# Patient Record
Sex: Female | Born: 1995 | Hispanic: Yes | Marital: Married | Smoking: Never smoker
Health system: Southern US, Community
[De-identification: ages and names within clinical notes are randomized; demographics above are authoritative.]

## PROBLEM LIST (undated history)

## (undated) DIAGNOSIS — Z5189 Encounter for other specified aftercare: Secondary | ICD-10-CM

## (undated) DIAGNOSIS — K219 Gastro-esophageal reflux disease without esophagitis: Secondary | ICD-10-CM

## (undated) DIAGNOSIS — B999 Unspecified infectious disease: Secondary | ICD-10-CM

## (undated) DIAGNOSIS — O139 Gestational [pregnancy-induced] hypertension without significant proteinuria, unspecified trimester: Secondary | ICD-10-CM

## (undated) DIAGNOSIS — R519 Headache, unspecified: Secondary | ICD-10-CM

## (undated) HISTORY — DX: Gestational (pregnancy-induced) hypertension without significant proteinuria, unspecified trimester: O13.9

## (undated) HISTORY — PX: NO PAST SURGERIES: SHX2092

---

## 2015-08-06 DIAGNOSIS — Z9289 Personal history of other medical treatment: Secondary | ICD-10-CM

## 2019-04-01 NOTE — L&D Delivery Note (Addendum)
OB/GYN Faculty Practice Delivery Note  Nicole Lane is a 24 y.o. [id number] s/p VD @0739  at [redacted]w[redacted]d. She was admitted for IOL for ICP.   ROM: 5h 26m with clear fluid GBS Status: Neg Maximum Maternal Temperature: 98.16F   Labor Progress: Labor induction with foley balloon and oxytocin   Delivery Date/Time: 12/03/19 at 7:39AM Delivery: Called to room and patient was complete and pushing. Head delivered LOA. No nuchal cord present. Shoulder and body delivered in usual fashion. Infant with spontaneous cry, placed on mother's abdomen, dried and stimulated. Cord clamped x 2 after 1-minute delay, and cut by Dr. 02/02/20 given that parents deferred. Cord blood drawn. Placenta delivered spontaneously with gentle cord traction. Fundus firm with massage and Pitocin. Labia, perineum, vagina, and cervix were inspected, 1st degree laceration that was repaired and right sulcal that was hemostatic.   Placenta: Intact, 3-vessel cord Complications: None Lacerations: 1st degree perineal repaired, right sulcal that was hemostatic  EBL: 475cc Analgesia: Epidural  Infant: boy  APGARs 9, 9  weight pending   Lynnda Shields PGY-1 Family Medicine   I was gloved and present for the delivery of baby and personally performed delivery of the placenta and repair of 1st degree perineal laceration as noted above.  Sabino Dick, MD OB Fellow, Faculty Practice 12/03/2019 9:55 AM

## 2019-06-27 ENCOUNTER — Encounter: Payer: Self-pay | Admitting: General Practice

## 2019-06-29 ENCOUNTER — Ambulatory Visit (INDEPENDENT_AMBULATORY_CARE_PROVIDER_SITE_OTHER): Payer: Self-pay | Admitting: *Deleted

## 2019-06-29 ENCOUNTER — Encounter: Payer: Self-pay | Admitting: General Practice

## 2019-06-29 ENCOUNTER — Other Ambulatory Visit: Payer: Self-pay

## 2019-06-29 VITALS — BP 104/74 | HR 103 | Temp 98.1°F | Ht 65.0 in | Wt 186.3 lb

## 2019-06-29 DIAGNOSIS — O219 Vomiting of pregnancy, unspecified: Secondary | ICD-10-CM

## 2019-06-29 DIAGNOSIS — Z3480 Encounter for supervision of other normal pregnancy, unspecified trimester: Secondary | ICD-10-CM | POA: Insufficient documentation

## 2019-06-29 MED ORDER — PROMETHAZINE HCL 25 MG PO TABS: 25.0000 mg | ORAL_TABLET | Freq: Four times a day (QID) | ORAL | 1 refills | Status: DC | PRN

## 2019-06-29 NOTE — Progress Notes (Addendum)
   PRENATAL INTAKE SUMMARY  Ms. Nicole Lane presents today New OB Nurse Interview.  OB History    Gravida  2   Para  1   Term      Preterm  1   AB      Living  1     SAB      TAB      Ectopic      Multiple      Live Births  1          I have reviewed the patient's medical, obstetrical, social, and family histories, medications, and available lab results.  SUBJECTIVE She complains of nausea with vomiting.  OBJECTIVE Initial nurse interview for history/labs (New OB)  EDD: 12/22/2019 by early ultrasound GA: [redacted]w[redacted]d G2P0101 FHT: 156  GENERAL APPEARANCE: alert, well appearing, in no apparent distress, oriented to person, place and time, well hydrated   ASSESSMENT Normal pregnancy  PLAN Prenatal care-[facility] Pnl/HIV  OB Urine Culture GC/CT/PAP at next visit with Donia Ast, NP 07/27/19 HgbEval/SMA/CF ([facility]) at next visit due to insurance Panorama at next visit due to insurance If CHTN - P/C Ratio and CMP Continue PNV Rx for Phenergan 25 mg sent to pharmacy Has BP monitor and weight scale at home Patient to sign up for Babyscripts and MyChart  Clovis Pu, RN

## 2019-06-29 NOTE — Patient Instructions (Signed)
 Second Trimester of Pregnancy The second trimester is from week 14 through week 27 (months 4 through 6). The second trimester is often a time when you feel your best. Your body has adjusted to being pregnant, and you begin to feel better physically. Usually, morning sickness has lessened or quit completely, you may have more energy, and you may have an increase in appetite. The second trimester is also a time when the fetus is growing rapidly. At the end of the sixth month, the fetus is about 9 inches long and weighs about 1 pounds. You will likely begin to feel the baby move (quickening) between 16 and 20 weeks of pregnancy. Body changes during your second trimester Your body continues to go through many changes during your second trimester. The changes vary from woman to woman.  Your weight will continue to increase. You will notice your lower abdomen bulging out.  You may begin to get stretch marks on your hips, abdomen, and breasts.  You may develop headaches that can be relieved by medicines. The medicines should be approved by your health care provider.  You may urinate more often because the fetus is pressing on your bladder.  You may develop or continue to have heartburn as a result of your pregnancy.  You may develop constipation because certain hormones are causing the muscles that push waste through your intestines to slow down.  You may develop hemorrhoids or swollen, bulging veins (varicose veins).  You may have back pain. This is caused by: ? Weight gain. ? Pregnancy hormones that are relaxing the joints in your pelvis. ? A shift in weight and the muscles that support your balance.  Your breasts will continue to grow and they will continue to become tender.  Your gums may bleed and may be sensitive to brushing and flossing.  Dark spots or blotches (chloasma, mask of pregnancy) may develop on your face. This will likely fade after the baby is born.  A dark line from  your belly button to the pubic area (linea nigra) may appear. This will likely fade after the baby is born.  You may have changes in your hair. These can include thickening of your hair, rapid growth, and changes in texture. Some women also have hair loss during or after pregnancy, or hair that feels dry or thin. Your hair will most likely return to normal after your baby is born. What to expect at prenatal visits During a routine prenatal visit:  You will be weighed to make sure you and the fetus are growing normally.  Your blood pressure will be taken.  Your abdomen will be measured to track your baby's growth.  The fetal heartbeat will be listened to.  Any test results from the previous visit will be discussed. Your health care provider may ask you:  How you are feeling.  If you are feeling the baby move.  If you have had any abnormal symptoms, such as leaking fluid, bleeding, severe headaches, or abdominal cramping.  If you are using any tobacco products, including cigarettes, chewing tobacco, and electronic cigarettes.  If you have any questions. Other tests that may be performed during your second trimester include:  Blood tests that check for: ? Low iron levels (anemia). ? High blood sugar that affects pregnant women (gestational diabetes) between 24 and 28 weeks. ? Rh antibodies. This is to check for a protein on red blood cells (Rh factor).  Urine tests to check for infections, diabetes, or protein in   the urine.  An ultrasound to confirm the proper growth and development of the baby.  An amniocentesis to check for possible genetic problems.  Fetal screens for spina bifida and Down syndrome.  HIV (human immunodeficiency virus) testing. Routine prenatal testing includes screening for HIV, unless you choose not to have this test. Follow these instructions at home: Medicines  Follow your health care provider's instructions regarding medicine use. Specific medicines  may be either safe or unsafe to take during pregnancy.  Take a prenatal vitamin that contains at least 600 micrograms (mcg) of folic acid.  If you develop constipation, try taking a stool softener if your health care provider approves. Eating and drinking   Eat a balanced diet that includes fresh fruits and vegetables, whole grains, good sources of protein such as meat, eggs, or tofu, and low-fat dairy. Your health care provider will help you determine the amount of weight gain that is right for you.  Avoid raw meat and uncooked cheese. These carry germs that can cause birth defects in the baby.  If you have low calcium intake from food, talk to your health care provider about whether you should take a daily calcium supplement.  Limit foods that are high in fat and processed sugars, such as fried and sweet foods.  To prevent constipation: ? Drink enough fluid to keep your urine clear or pale yellow. ? Eat foods that are high in fiber, such as fresh fruits and vegetables, whole grains, and beans. Activity  Exercise only as directed by your health care provider. Most women can continue their usual exercise routine during pregnancy. Try to exercise for 30 minutes at least 5 days a week. Stop exercising if you experience uterine contractions.  Avoid heavy lifting, wear low heel shoes, and practice good posture.  A sexual relationship may be continued unless your health care provider directs you otherwise. Relieving pain and discomfort  Wear a good support bra to prevent discomfort from breast tenderness.  Take warm sitz baths to soothe any pain or discomfort caused by hemorrhoids. Use hemorrhoid cream if your health care provider approves.  Rest with your legs elevated if you have leg cramps or low back pain.  If you develop varicose veins, wear support hose. Elevate your feet for 15 minutes, 3-4 times a day. Limit salt in your diet. Prenatal Care  Write down your questions. Take  them to your prenatal visits.  Keep all your prenatal visits as told by your health care provider. This is important. Safety  Wear your seat belt at all times when driving.  Make a list of emergency phone numbers, including numbers for family, friends, the hospital, and police and fire departments. General instructions  Ask your health care provider for a referral to a local prenatal education class. Begin classes no later than the beginning of month 6 of your pregnancy.  Ask for help if you have counseling or nutritional needs during pregnancy. Your health care provider can offer advice or refer you to specialists for help with various needs.  Do not use hot tubs, steam rooms, or saunas.  Do not douche or use tampons or scented sanitary pads.  Do not cross your legs for long periods of time.  Avoid cat litter boxes and soil used by cats. These carry germs that can cause birth defects in the baby and possibly loss of the fetus by miscarriage or stillbirth.  Avoid all smoking, herbs, alcohol, and unprescribed drugs. Chemicals in these products can affect the   formation and growth of the baby.  Do not use any products that contain nicotine or tobacco, such as cigarettes and e-cigarettes. If you need help quitting, ask your health care provider.  Visit your dentist if you have not gone yet during your pregnancy. Use a soft toothbrush to brush your teeth and be gentle when you floss. Contact a health care provider if:  You have dizziness.  You have mild pelvic cramps, pelvic pressure, or nagging pain in the abdominal area.  You have persistent nausea, vomiting, or diarrhea.  You have a bad smelling vaginal discharge.  You have pain when you urinate. Get help right away if:  You have a fever.  You are leaking fluid from your vagina.  You have spotting or bleeding from your vagina.  You have severe abdominal cramping or pain.  You have rapid weight gain or weight loss.  You  have shortness of breath with chest pain.  You notice sudden or extreme swelling of your face, hands, ankles, feet, or legs.  You have not felt your baby move in over an hour.  You have severe headaches that do not go away when you take medicine.  You have vision changes. Summary  The second trimester is from week 14 through week 27 (months 4 through 6). It is also a time when the fetus is growing rapidly.  Your body goes through many changes during pregnancy. The changes vary from woman to woman.  Avoid all smoking, herbs, alcohol, and unprescribed drugs. These chemicals affect the formation and growth your baby.  Do not use any tobacco products, such as cigarettes, chewing tobacco, and e-cigarettes. If you need help quitting, ask your health care provider.  Contact your health care provider if you have any questions. Keep all prenatal visits as told by your health care provider. This is important. This information is not intended to replace advice given to you by your health care provider. Make sure you discuss any questions you have with your health care provider. Document Revised: 07/09/2018 Document Reviewed: 04/22/2016 Elsevier Patient Education  2020 [employer].  Warning Signs During Pregnancy A pregnancy lasts about 40 weeks, starting from the first day of your last period until the baby is born. Pregnancy is divided into three phases called trimesters.  The first trimester refers to week 1 through week 13 of pregnancy.  The second trimester is the start of week 14 through the end of week 27.  The third trimester is the start of week 28 until you deliver your baby. During each trimester of pregnancy, certain signs and symptoms may indicate a problem. Talk with your health care provider about your current health and any medical conditions you have. Make sure you know the symptoms that you should watch for and report. How does this affect me?  Warning signs in the first  trimester While some changes during the first trimester may be uncomfortable, most do not represent a serious problem. Let your health care provider know if you have any of the following warning signs in the first trimester:  You cannot eat or drink without vomiting, and this lasts for longer than a day.  You have vaginal bleeding or spotting along with menstrual-like cramping.  You have diarrhea for longer than a day.  You have a fever or other signs of infection, such as: ? Pain or burning when you urinate. ? Foul smelling or thick or yellowish vaginal discharge. Warning signs in the second trimester As your baby  grows and changes during the second trimester, there are additional signs and symptoms that may indicate a problem. These include:  Signs and symptoms of infection, including a fever.  Signs or symptoms of a miscarriage or preterm labor, such as regular contractions, menstrual-like cramping, or lower abdominal pain.  Bloody or watery vaginal discharge or obvious vaginal bleeding.  Feeling like your heart is pounding.  Having trouble breathing.  Nausea, vomiting, or diarrhea that lasts for longer than a day.  Craving non-food items, such as clay, chalk, or dirt. This may be a sign of a very treatable medical condition called pica. Later in your second trimester, watch for signs and symptoms of a serious medical condition called preeclampsia.These include:  Changes in your vision.  A severe headache that does not go away.  Nausea and vomiting. It is also important to notice if your baby stops moving or moves less than usual during this time. Warning signs in the third trimester As you approach the third trimester, your baby is growing and your body is preparing for the birth of your baby. In your third trimester, be sure to let your health care provider know if:  You have signs and symptoms of infection, including a fever.  You have vaginal bleeding.  You notice  that your baby is moving less than usual or is not moving.  You have nausea, vomiting, or diarrhea that lasts for longer than a day.  You have a severe headache that does not go away.  You have vision changes, including seeing spots or having blurry or double vision.  You have increased swelling in your hands or face. How does this affect my baby? Throughout your pregnancy, always report any of the warning signs of a problem to your health care provider. This can help prevent complications that may affect your baby, including:  Increased risk for premature birth.  Infection that may be transmitted to your baby.  Increased risk for stillbirth. Contact a health care provider if:  You have any of the warning signs of a problem for the current trimester of your pregnancy.  Any of the following apply to you during any trimester of pregnancy: ? You have strong emotions, such as sadness or anxiety, that interfere with work or personal relationships. ? You feel unsafe in your home and need help finding a safe place to live. ? You are using tobacco products, alcohol, or drugs and you need help to stop. Get help right away if: You have signs or symptoms of labor before 37 weeks of pregnancy. These include:  Contractions that are 5 minutes or less apart, or that increase in frequency, intensity, or length.  Sudden, sharp abdominal pain or low back pain.  Uncontrolled gush or trickle of fluid from your vagina. Summary  A pregnancy lasts about 40 weeks, starting from the first day of your last period until the baby is born. Pregnancy is divided into three phases called trimesters. Each trimester has warning signs to watch for.  Always report any warning signs to your health care provider in order to prevent complications that may affect both you and your baby.  Talk with your health care provider about your current health and any medical conditions you have. Make sure you know the symptoms  that you should watch for and report. This information is not intended to replace advice given to you by your health care provider. Make sure you discuss any questions you have with your health care provider. Document  Revised: 07/06/2018 Document Reviewed: 01/01/2017 Elsevier Patient Education  [place].

## 2019-06-30 LAB — OBSTETRIC PANEL, INCLUDING HIV
Antibody Screen: NEGATIVE
Basophils Absolute: 0 10*3/uL (ref 0.0–0.2)
Basos: 0 %
EOS (ABSOLUTE): 0.2 10*3/uL (ref 0.0–0.4)
Eos: 3 %
HIV Screen 4th Generation wRfx: NONREACTIVE
Hematocrit: 40.2 % (ref 34.0–46.6)
Hemoglobin: 13.1 g/dL (ref 11.1–15.9)
Hepatitis B Surface Ag: NEGATIVE
Immature Grans (Abs): 0 10*3/uL (ref 0.0–0.1)
Immature Granulocytes: 0 %
Lymphocytes Absolute: 1.3 10*3/uL (ref 0.7–3.1)
Lymphs: 17 %
MCH: 28.5 pg (ref 26.6–33.0)
MCHC: 32.6 g/dL (ref 31.5–35.7)
MCV: 88 fL (ref 79–97)
Monocytes Absolute: 0.4 10*3/uL (ref 0.1–0.9)
Monocytes: 6 %
Neutrophils Absolute: 5.8 10*3/uL (ref 1.4–7.0)
Neutrophils: 74 %
Platelets: 254 10*3/uL (ref 150–450)
RBC: 4.59 x10E6/uL (ref 3.77–5.28)
RDW: 13.9 % (ref 11.7–15.4)
RPR Ser Ql: NONREACTIVE
Rh Factor: POSITIVE
Rubella Antibodies, IGG: 1.16 index (ref >0.99)
WBC: 7.8 10*3/uL (ref 3.4–10.8)

## 2019-06-30 LAB — HEPATITIS C ANTIBODY: Hep C Virus Ab: <0.1 s/co ratio (ref 0.0–0.9)

## 2019-06-30 LAB — COMPREHENSIVE METABOLIC PANEL
ALT: 18 IU/L (ref 0–32)
AST: 21 IU/L (ref 0–40)
Albumin/Globulin Ratio: 1.7 (ref 1.2–2.2)
Albumin: 4 g/dL (ref 3.9–5.0)
Alkaline Phosphatase: 84 IU/L (ref 39–117)
BUN/Creatinine Ratio: 9 (ref 9–23)
BUN: 5 mg/dL — ABNORMAL LOW (ref 6–20)
Bilirubin Total: 0.4 mg/dL (ref 0.0–1.2)
CO2: 21 mmol/L (ref 20–29)
Calcium: 9.2 mg/dL (ref 8.7–10.2)
Chloride: 100 mmol/L (ref 96–106)
Creatinine, Ser: 0.54 mg/dL — ABNORMAL LOW (ref 0.57–1.00)
GFR calc Af Amer: 153 mL/min/{1.73_m2} (ref >59)
GFR calc non Af Amer: 133 mL/min/{1.73_m2} (ref >59)
Globulin, Total: 2.3 g/dL (ref 1.5–4.5)
Glucose: 74 mg/dL (ref 65–99)
Potassium: 3.8 mmol/L (ref 3.5–5.2)
Sodium: 135 mmol/L (ref 134–144)
Total Protein: 6.3 g/dL (ref 6.0–8.5)

## 2019-06-30 LAB — PROTEIN / CREATININE RATIO, URINE
Creatinine, Urine: 82.3 mg/dL
Protein, Ur: 13.9 mg/dL
Protein/Creat Ratio: 169 mg/g creat (ref 0–200)

## 2019-07-01 LAB — URINE CULTURE, OB REFLEX

## 2019-07-01 LAB — CULTURE, OB URINE

## 2019-07-07 ENCOUNTER — Encounter: Payer: Self-pay | Admitting: General Practice

## 2019-07-19 ENCOUNTER — Encounter: Payer: Self-pay | Admitting: Women's Health

## 2019-07-19 ENCOUNTER — Other Ambulatory Visit: Payer: Self-pay | Admitting: *Deleted

## 2019-07-25 ENCOUNTER — Other Ambulatory Visit: Payer: Self-pay | Admitting: *Deleted

## 2019-07-25 DIAGNOSIS — O219 Vomiting of pregnancy, unspecified: Secondary | ICD-10-CM

## 2019-07-25 MED ORDER — PROMETHAZINE HCL 25 MG PO TABS: 25.0000 mg | ORAL_TABLET | Freq: Four times a day (QID) | ORAL | 1 refills | Status: DC | PRN

## 2019-07-27 ENCOUNTER — Encounter: Payer: Self-pay | Admitting: Women's Health

## 2019-07-27 ENCOUNTER — Encounter: Payer: Self-pay | Admitting: General Practice

## 2019-07-27 ENCOUNTER — Other Ambulatory Visit (HOSPITAL_COMMUNITY)
Admission: RE | Admit: 2019-07-27 | Discharge: 2019-07-27 | Disposition: A | Discharge status: Home / Self Care | Payer: Medicaid Other | Source: Ambulatory Visit | Attending: Women's Health | Admitting: Women's Health

## 2019-07-27 ENCOUNTER — Other Ambulatory Visit: Payer: Self-pay

## 2019-07-27 ENCOUNTER — Ambulatory Visit (INDEPENDENT_AMBULATORY_CARE_PROVIDER_SITE_OTHER): Payer: Medicaid Other | Admitting: Women's Health

## 2019-07-27 VITALS — BP 108/73 | HR 99 | Temp 97.6°F | Wt 193.4 lb

## 2019-07-27 DIAGNOSIS — Z3480 Encounter for supervision of other normal pregnancy, unspecified trimester: Secondary | ICD-10-CM

## 2019-07-27 DIAGNOSIS — O09299 Supervision of pregnancy with other poor reproductive or obstetric history, unspecified trimester: Secondary | ICD-10-CM

## 2019-07-27 DIAGNOSIS — O219 Vomiting of pregnancy, unspecified: Secondary | ICD-10-CM

## 2019-07-27 MED ORDER — ASPIRIN EC 81 MG PO TBEC: 81.0000 mg | DELAYED_RELEASE_TABLET | Freq: Every day | ORAL | 4 refills | Status: DC

## 2019-07-27 NOTE — Progress Notes (Signed)
History:   Nicole Lane is a 24 y.o. G2P1001 at [redacted]w[redacted]d by LMP being seen today for her first obstetrical visit.  Her obstetrical history is significant for history of preeclampsia. Patient does intend to breast feed. Pregnancy history fully reviewed.  Pt reports this is a desired and planned pregnancy. Allergies: NKDA Current Medications: PNVs, phenergan PMH: No HTN, DM, asthma. PSH: none OB Hx: 2017 - preeclampsia, delivered 35/36 weeks d/t preeclampsia, NSVD Social Hx: pt does not smoke, drink, or use drugs. Family Hx: none  Patient reports no complaints.      HISTORY: OB History  Gravida Para Term Preterm AB Living  2 1 1  0 0 1  SAB TAB Ectopic Multiple Live Births  0 0 0 0 1    # Outcome Date GA Lbr Len/2nd Weight Sex Delivery Anes PTL Lv  2 Current           1 Term 08/06/15 [redacted]w[redacted]d  6 lb 3 oz (2.807 kg) M Vag-Spont EPI N LIV     Complications: History of blood transfusion     Apgar1: 8  Apgar5: 8    Last pap smear was done 01/2019 and was normal  Past Medical History:  Diagnosis Date  . Pregnancy induced hypertension    Past Surgical History:  Procedure Laterality Date  . NO PAST SURGERIES     Family History  Problem Relation Age of Onset  . Cancer Father    Social History   Tobacco Use  . Smoking status: Never Smoker  . Smokeless tobacco: Never Used  Substance Use Topics  . Alcohol use: Never  . Drug use: Never   No Known Allergies Current Outpatient Medications on File Prior to Visit  Medication Sig Dispense Refill  . Prenatal Vit-Fe Fumarate-FA (MULTIVITAMIN-PRENATAL) 27-0.8 MG TABS tablet Take 1 tablet by mouth daily at 12 noon.    . promethazine (PHENERGAN) 25 MG tablet Take 1 tablet (25 mg total) by mouth every 6 (six) hours as needed for nausea or vomiting. 30 tablet 1   No current facility-administered medications on file prior to visit.    Review of Systems Pertinent items noted in HPI and remainder of comprehensive ROS otherwise  negative. Physical Exam:   Vitals:   07/27/19 1446  BP: 108/73  Pulse: 99  Temp: 97.6 F (36.4 C)  Weight: 193 lb 6.4 oz (87.7 kg)   Fetal Heart Rate (bpm): 147 Uterus:   FH c/w dates  Pelvic Exam: Perineum: declined   Vulva: declined   Vagina:  declined   Cervix: declined   Adnexa: declined   Bony Pelvis: declined  System: General: well-developed, well-nourished female in no acute distress   Breasts:  declined   Skin: normal coloration and turgor, no rashes   Neurologic: oriented, normal, negative, normal mood   Extremities: normal strength, tone, and muscle mass, ROM of all joints is normal   HEENT PERRLA, extraocular movement intact and sclera clear, anicteric       Neck supple and no masses   Cardiovascular: regular rate and rhythm   Respiratory:  no respiratory distress, normal breath sounds   Abdomen: soft, non-tender; bowel sounds normal; no masses,  no organomegaly     Assessment:    Pregnancy: G2P1001 Patient Active Problem List   Diagnosis Date Noted  . Supervision of other normal pregnancy, antepartum 06/29/2019  . Nausea and vomiting of pregnancy, antepartum 06/29/2019     Plan:    1. Supervision of other normal pregnancy, antepartum -  Cervicovaginal ancillary only( [place]) - [place] MFM OB COMP + 14 WK; Future - AFP, Serum, Open Spina Bifida - Genetic Screening  2. Nausea and vomiting of pregnancy, antepartum -no concerns  3. Hx of preeclampsia, prior pregnancy, currently pregnant - aspirin EC 81 MG tablet; Take 1 tablet (81 mg total) by mouth daily.  Dispense: 30 tablet; Refill: 4   Initial labs drawn. Continue prenatal vitamins. Genetic Screening discussed, NIPS: requested. Patient aware that her insurance as it stands doe snot cover this testing and she may be required to pay out-of-pocket unless she can get her insurance switched over quickly after testing. Patient reports she is OK paying out-of-pocket for these tests. Ultrasound discussed;  fetal anatomic survey: requested. Problem list reviewed and updated. The nature of [place] [facility] with multiple MDs and other Advanced Practice Providers was explained to patient; also emphasized that residents, students are part of our team. Routine obstetric precautions reviewed. Return in about 4 weeks (around 08/24/2019) for virtual rob, needs anatomy scan ASAP.     Marylen Ponto, NP  3:28 PM 07/27/2019

## 2019-07-27 NOTE — Patient Instructions (Addendum)
Maternity Assessment Unit (MAU)  The Maternity Assessment Unit (MAU) is located at the [facility] at [facility]. The address is: [address], [place], [place], [place] [address]. Please see map below for additional directions.    The Maternity Assessment Unit is designed to help you during your pregnancy, and for up to 6 weeks after delivery, with any pregnancy- or postpartum-related emergencies, if you think you are in labor, or if your water has broken. For example, if you experience nausea and vomiting, vaginal bleeding, severe abdominal or pelvic pain, elevated blood pressure or other problems related to your pregnancy or postpartum time, please come to the Maternity Assessment Unit for assistance.         Second Trimester of Pregnancy The second trimester is from week 14 through week 27 (months 4 through 6). The second trimester is often a time when you feel your best. Your body has adjusted to being pregnant, and you begin to feel better physically. Usually, morning sickness has lessened or quit completely, you may have more energy, and you may have an increase in appetite. The second trimester is also a time when the fetus is growing rapidly. At the end of the sixth month, the fetus is about 9 inches long and weighs about 1 pounds. You will likely begin to feel the baby move (quickening) between 16 and 20 weeks of pregnancy. Body changes during your second trimester Your body continues to go through many changes during your second trimester. The changes vary from woman to woman.  Your weight will continue to increase. You will notice your lower abdomen bulging out.  You may begin to get stretch marks on your hips, abdomen, and breasts.  You may develop headaches that can be relieved by medicines. The medicines should be approved by your health care provider.  You may urinate more often because the fetus is pressing on your bladder.  You may  develop or continue to have heartburn as a result of your pregnancy.  You may develop constipation because certain hormones are causing the muscles that push waste through your intestines to slow down.  You may develop hemorrhoids or swollen, bulging veins (varicose veins).  You may have back pain. This is caused by: ? Weight gain. ? Pregnancy hormones that are relaxing the joints in your pelvis. ? A shift in weight and the muscles that support your balance.  Your breasts will continue to grow and they will continue to become tender.  Your gums may bleed and may be sensitive to brushing and flossing.  Dark spots or blotches (chloasma, mask of pregnancy) may develop on your face. This will likely fade after the baby is born.  A dark line from your belly button to the pubic area (linea nigra) may appear. This will likely fade after the baby is born.  You may have changes in your hair. These can include thickening of your hair, rapid growth, and changes in texture. Some women also have hair loss during or after pregnancy, or hair that feels dry or thin. Your hair will most likely return to normal after your baby is born. What to expect at prenatal visits During a routine prenatal visit:  You will be weighed to make sure you and the fetus are growing normally.  Your blood pressure will be taken.  Your abdomen will be measured to track your baby's growth.  The fetal heartbeat will be listened to.  Any test results from the previous  visit will be discussed. Your health care provider may ask you:  How you are feeling.  If you are feeling the baby move.  If you have had any abnormal symptoms, such as leaking fluid, bleeding, severe headaches, or abdominal cramping.  If you are using any tobacco products, including cigarettes, chewing tobacco, and electronic cigarettes.  If you have any questions. Other tests that may be performed during your second trimester include:  Blood tests  that check for: ? Low iron levels (anemia). ? High blood sugar that affects pregnant women (gestational diabetes) between 23 and 28 weeks. ? Rh antibodies. This is to check for a protein on red blood cells (Rh factor).  Urine tests to check for infections, diabetes, or protein in the urine.  An ultrasound to confirm the proper growth and development of the baby.  An amniocentesis to check for possible genetic problems.  Fetal screens for spina bifida and Down syndrome.  HIV (human immunodeficiency virus) testing. Routine prenatal testing includes screening for HIV, unless you choose not to have this test. Follow these instructions at home: Medicines  Follow your health care provider's instructions regarding medicine use. Specific medicines may be either safe or unsafe to take during pregnancy.  Take a prenatal vitamin that contains at least 600 micrograms (mcg) of folic acid.  If you develop constipation, try taking a stool softener if your health care provider approves. Eating and drinking   Eat a balanced diet that includes fresh fruits and vegetables, whole grains, good sources of protein such as meat, eggs, or tofu, and low-fat dairy. Your health care provider will help you determine the amount of weight gain that is right for you.  Avoid raw meat and uncooked cheese. These carry germs that can cause birth defects in the baby.  If you have low calcium intake from food, talk to your health care provider about whether you should take a daily calcium supplement.  Limit foods that are high in fat and processed sugars, such as fried and sweet foods.  To prevent constipation: ? Drink enough fluid to keep your urine clear or pale yellow. ? Eat foods that are high in fiber, such as fresh fruits and vegetables, whole grains, and beans. Activity  Exercise only as directed by your health care provider. Most women can continue their usual exercise routine during pregnancy. Try to  exercise for 30 minutes at least 5 days a week. Stop exercising if you experience uterine contractions.  Avoid heavy lifting, wear low heel shoes, and practice good posture.  A sexual relationship may be continued unless your health care provider directs you otherwise. Relieving pain and discomfort  Wear a good support bra to prevent discomfort from breast tenderness.  Take warm sitz baths to soothe any pain or discomfort caused by hemorrhoids. Use hemorrhoid cream if your health care provider approves.  Rest with your legs elevated if you have leg cramps or low back pain.  If you develop varicose veins, wear support hose. Elevate your feet for 15 minutes, 3-4 times a day. Limit salt in your diet. Prenatal Care  Write down your questions. Take them to your prenatal visits.  Keep all your prenatal visits as told by your health care provider. This is important. Safety  Wear your seat belt at all times when driving.  Make a list of emergency phone numbers, including numbers for family, friends, the hospital, and police and fire departments. General instructions  Ask your health care provider for a referral to  a local prenatal education class. Begin classes no later than the beginning of month 6 of your pregnancy.  Ask for help if you have counseling or nutritional needs during pregnancy. Your health care provider can offer advice or refer you to specialists for help with various needs.  Do not use hot tubs, steam rooms, or saunas.  Do not douche or use tampons or scented sanitary pads.  Do not cross your legs for long periods of time.  Avoid cat litter boxes and soil used by cats. These carry germs that can cause birth defects in the baby and possibly loss of the fetus by miscarriage or stillbirth.  Avoid all smoking, herbs, alcohol, and unprescribed drugs. Chemicals in these products can affect the formation and growth of the baby.  Do not use any products that contain nicotine  or tobacco, such as cigarettes and e-cigarettes. If you need help quitting, ask your health care provider.  Visit your dentist if you have not gone yet during your pregnancy. Use a soft toothbrush to brush your teeth and be gentle when you floss. Contact a health care provider if:  You have dizziness.  You have mild pelvic cramps, pelvic pressure, or nagging pain in the abdominal area.  You have persistent nausea, vomiting, or diarrhea.  You have a bad smelling vaginal discharge.  You have pain when you urinate. Get help right away if:  You have a fever.  You are leaking fluid from your vagina.  You have spotting or bleeding from your vagina.  You have severe abdominal cramping or pain.  You have rapid weight gain or weight loss.  You have shortness of breath with chest pain.  You notice sudden or extreme swelling of your face, hands, ankles, feet, or legs.  You have not felt your baby move in over an hour.  You have severe headaches that do not go away when you take medicine.  You have vision changes. Summary  The second trimester is from week 14 through week 27 (months 4 through 6). It is also a time when the fetus is growing rapidly.  Your body goes through many changes during pregnancy. The changes vary from woman to woman.  Avoid all smoking, herbs, alcohol, and unprescribed drugs. These chemicals affect the formation and growth your baby.  Do not use any tobacco products, such as cigarettes, chewing tobacco, and e-cigarettes. If you need help quitting, ask your health care provider.  Contact your health care provider if you have any questions. Keep all prenatal visits as told by your health care provider. This is important. This information is not intended to replace advice given to you by your health care provider. Make sure you discuss any questions you have with your health care provider. Document Revised: 07/09/2018 Document Reviewed: 04/22/2016 Elsevier  Patient Education  2020 [employer].        Alpha-Fetoprotein Test Why am I having this test? The alpha-fetoprotein test is most commonly used in pregnant women to help screen for birth defects in their unborn baby. It can be used to screen for birth defects, such as chromosome (DNA) abnormalities, problems with the brain or spinal cord, or problems with the abdominal wall of the unborn baby (fetus). The alpha-fetoprotein test may also be done for men or non-pregnant women to check for certain cancers. What is being tested? This test measures the amount of alpha-fetoprotein (AFP) in your blood. AFP is a protein that is made by the liver. Levels can be detected  in the mother's blood during pregnancy, starting at 10 weeks and peaking at 16-18 weeks of the pregnancy. Abnormal levels can sometimes be a sign of a birth defect in the baby. Certain cancers can cause a high level of AFP in men and non-pregnant women. What kind of sample is taken?  A blood sample is required for this test. It is usually collected by inserting a needle into a blood vessel. How are the results reported? Your test results will be reported as values. Your health care provider will compare your results to normal ranges that were established after testing a large group of people (reference values). Reference values may vary among labs and hospitals. For this test, common reference values are:  Adult: Less than 40 ng/mL or less than 40 mcg/L (SI units).  Child younger than 1 year: Less than 30 ng/mL. If you are pregnant, the values may also vary based on how long you have been pregnant. What do the results mean? Results that are above the reference values in pregnant women may indicate the following for the baby:  Neural tube defects, such as abnormalities of the spinal cord or brain.  Abdominal wall defects.  Multiple pregnancy such as twins.  Fetal distress or fetal death. Results that are above the reference  values in men or non-pregnant women may indicate:  Reproductive cancers, such as ovarian or testicular cancer.  Liver cancer.  Liver cell death.  Other types of cancer. Very low levels of AFP in pregnant women may indicate the following for the baby:  Down syndrome.  Fetal death. Talk with your health care provider about what your results mean. Questions to ask your health care provider Ask your health care provider, or the department that is doing the test:  When will my results be ready?  How will I get my results?  What are my treatment options?  What other tests do I need?  What are my next steps? Summary  The alpha-fetoprotein test is done on pregnant women to help screen for birth defects in their unborn baby.  Certain cancers can cause a high level of AFP in men and non-pregnant women.  For this test, a blood sample is usually collected by inserting a needle into a blood vessel.  Talk with your health care provider about what your results mean. This information is not intended to replace advice given to you by your health care provider. Make sure you discuss any questions you have with your health care provider. Document Revised: 02/27/2017 Document Reviewed: 10/21/2016 Elsevier Patient Education  2020 Elsevier Inc.      Preeclampsia and Eclampsia Preeclampsia is a serious condition that may develop during pregnancy. This condition causes high blood pressure and increased protein in your urine along with other symptoms, such as headaches and vision changes. These symptoms may develop as the condition gets worse. Preeclampsia may occur at 20 weeks of pregnancy or later. Diagnosing and treating preeclampsia early is very important. If not treated early, it can cause serious problems for you and your baby. One problem it can lead to is eclampsia. Eclampsia is a condition that causes muscle jerking or shaking (convulsions or seizures) and other serious problems for  the mother. During pregnancy, delivering your baby may be the best treatment for preeclampsia or eclampsia. For most women, preeclampsia and eclampsia symptoms go away after giving birth. In rare cases, a woman may develop preeclampsia after giving birth (postpartum preeclampsia). This usually occurs within 48 hours after childbirth  but may occur up to 6 weeks after giving birth. What are the causes? The cause of preeclampsia is not known. What increases the risk? The following risk factors make you more likely to develop preeclampsia:  Being pregnant for the first time.  Having had preeclampsia during a past pregnancy.  Having a family history of preeclampsia.  Having high blood pressure.  Being pregnant with more than one baby.  Being 77 or older.  Being African-American.  Having kidney disease or diabetes.  Having medical conditions such as lupus or blood diseases.  Being very overweight (obese). What are the signs or symptoms? The most common symptoms are:  Severe headaches.  Vision problems, such as blurred or double vision.  Abdominal pain, especially upper abdominal pain. Other symptoms that may develop as the condition gets worse include:  Sudden weight gain.  Sudden swelling of the hands, face, legs, and feet.  Severe nausea and vomiting.  Numbness in the face, arms, legs, and feet.  Dizziness.  Urinating less than usual.  Slurred speech.  Convulsions or seizures. How is this diagnosed? There are no screening tests for preeclampsia. Your health care provider will ask you about symptoms and check for signs of preeclampsia during your prenatal visits. You may also have tests that include:  Checking your blood pressure.  Urine tests to check for protein. Your health care provider will check for this at every prenatal visit.  Blood tests.  Monitoring your baby's heart rate.  Ultrasound. How is this treated? You and your health care provider will  determine the treatment approach that is best for you. Treatment may include:  Having more frequent prenatal exams to check for signs of preeclampsia, if you have an increased risk for preeclampsia.  Medicine to lower your blood pressure.  Staying in the hospital, if your condition is severe. There, treatment will focus on controlling your blood pressure and the amount of fluids in your body (fluid retention).  Taking medicine (magnesium sulfate) to prevent seizures. This may be given as an injection or through an IV.  Taking a low-dose aspirin during your pregnancy.  Delivering your baby early. You may have your labor started with medicine (induced), or you may have a cesarean delivery. Follow these instructions at home: Eating and drinking   Drink enough fluid to keep your urine pale yellow.  Avoid caffeine. Lifestyle  Do not use any products that contain nicotine or tobacco, such as cigarettes and e-cigarettes. If you need help quitting, ask your health care provider.  Do not use alcohol or drugs.  Avoid stress as much as possible. Rest and get plenty of sleep. General instructions  Take over-the-counter and prescription medicines only as told by your health care provider.  When lying down, lie on your left side. This keeps pressure off your major blood vessels.  When sitting or lying down, raise (elevate) your feet. Try putting some pillows underneath your lower legs.  Exercise regularly. Ask your health care provider what kinds of exercise are best for you.  Keep all follow-up and prenatal visits as told by your health care provider. This is important. How is this prevented? There is no known way of preventing preeclampsia or eclampsia from developing. However, to lower your risk of complications and detect problems early:  Get regular prenatal care. Your health care provider may be able to diagnose and treat the condition early.  Maintain a healthy weight. Ask your  health care provider for help managing weight gain during pregnancy.  Work with your health care provider to manage any long-term (chronic) health conditions you have, such as diabetes or kidney problems.  You may have tests of your blood pressure and kidney function after giving birth.  Your health care provider may have you take low-dose aspirin during your next pregnancy. Contact a health care provider if:  You have symptoms that your health care provider told you may require more treatment or monitoring, such as: ? Headaches. ? Nausea or vomiting. ? Abdominal pain. ? Dizziness. ? Light-headedness. Get help right away if:  You have severe: ? Abdominal pain. ? Headaches that do not get better. ? Dizziness. ? Vision problems. ? Confusion. ? Nausea or vomiting.  You have any of the following: ? A seizure. ? Sudden, rapid weight gain. ? Sudden swelling in your hands, ankles, or face. ? Trouble moving any part of your body. ? Numbness in any part of your body. ? Trouble speaking. ? Abnormal bleeding.  You faint. Summary  Preeclampsia is a serious condition that may develop during pregnancy.  This condition causes high blood pressure and increased protein in your urine along with other symptoms, such as headaches and vision changes.  Diagnosing and treating preeclampsia early is very important. If not treated early, it can cause serious problems for you and your baby.  Get help right away if you have symptoms that your health care provider told you to watch for. This information is not intended to replace advice given to you by your health care provider. Make sure you discuss any questions you have with your health care provider. Document Revised: 11/17/2017 Document Reviewed: 10/22/2015 Elsevier Patient Education  2020 [employer].

## 2019-07-28 LAB — CERVICOVAGINAL ANCILLARY ONLY
Bacterial Vaginitis (gardnerella): POSITIVE — AB
Candida Glabrata: NEGATIVE
Candida Vaginitis: NEGATIVE
Chlamydia: NEGATIVE
Comment: NEGATIVE
Comment: NEGATIVE
Comment: NEGATIVE
Comment: NEGATIVE
Comment: NEGATIVE
Comment: NORMAL
Neisseria Gonorrhea: NEGATIVE
Trichomonas: NEGATIVE

## 2019-07-29 LAB — AFP, SERUM, OPEN SPINA BIFIDA
AFP MoM: 0.63
AFP Value: 26.2 ng/mL
Gest. Age on Collection Date: 18.6 weeks
Maternal Age At EDD: 24.8 yr
OSBR Risk 1 IN: 10000
Test Results:: NEGATIVE
Weight: 192 [lb_av]

## 2019-08-09 ENCOUNTER — Encounter: Payer: Self-pay | Admitting: General Practice

## 2019-08-16 ENCOUNTER — Ambulatory Visit (HOSPITAL_COMMUNITY): Payer: Medicaid Other | Attending: Obstetrics and Gynecology

## 2019-08-16 ENCOUNTER — Other Ambulatory Visit: Payer: Self-pay

## 2019-08-16 DIAGNOSIS — Z3480 Encounter for supervision of other normal pregnancy, unspecified trimester: Secondary | ICD-10-CM | POA: Insufficient documentation

## 2019-08-16 DIAGNOSIS — Z3A21 21 weeks gestation of pregnancy: Secondary | ICD-10-CM

## 2019-08-16 DIAGNOSIS — Z363 Encounter for antenatal screening for malformations: Secondary | ICD-10-CM

## 2019-08-16 DIAGNOSIS — O09292 Supervision of pregnancy with other poor reproductive or obstetric history, second trimester: Secondary | ICD-10-CM

## 2019-08-25 ENCOUNTER — Telehealth (INDEPENDENT_AMBULATORY_CARE_PROVIDER_SITE_OTHER): Payer: Medicaid Other | Admitting: Obstetrics and Gynecology

## 2019-08-25 DIAGNOSIS — O219 Vomiting of pregnancy, unspecified: Secondary | ICD-10-CM

## 2019-08-25 DIAGNOSIS — Z3480 Encounter for supervision of other normal pregnancy, unspecified trimester: Secondary | ICD-10-CM

## 2019-08-25 DIAGNOSIS — O09299 Supervision of pregnancy with other poor reproductive or obstetric history, unspecified trimester: Secondary | ICD-10-CM

## 2019-08-25 DIAGNOSIS — O212 Late vomiting of pregnancy: Secondary | ICD-10-CM

## 2019-08-25 DIAGNOSIS — Z3A23 23 weeks gestation of pregnancy: Secondary | ICD-10-CM

## 2019-08-25 DIAGNOSIS — O09292 Supervision of pregnancy with other poor reproductive or obstetric history, second trimester: Secondary | ICD-10-CM

## 2019-08-25 NOTE — Progress Notes (Signed)
   MY CHART VIDEO VIRTUAL OBSTETRICS VISIT ENCOUNTER NOTE  I connected with Nicole Lane on 08/26/19 at 10:30 AM EDT by My Chart video at work and verified that I am speaking with the correct person using two identifiers. Provider located at [facility] for [facility] at [place].   I discussed the limitations, risks, security and privacy concerns of performing an evaluation and management service by My Chart video and the availability of in person appointments. I also discussed with the patient that there may be a patient respons ible charge related to this service. The patient expressed understanding and agreed to proceed.  Subjective:  Nicole Lane is a 24 y.o. G2P1001 at [redacted]w[redacted]d being followed for ongoing prenatal care.  She is currently monitored for the following issues for this low-risk pregnancy and has Supervision of other normal pregnancy, antepartum and Nausea and vomiting of pregnancy, antepartum on their problem list.  Patient reports no complaints. Reports fetal movement. Denies any contractions, bleeding or leaking of fluid.   The following portions of the patient's history were reviewed and updated as appropriate: allergies, current medications, past family history, past medical history, past social history, past surgical history and problem list.   Objective:   General:  Alert, oriented and cooperative.   Mental Status: Normal mood and affect perceived. Normal judgment and thought content.  Rest of physical exam deferred due to type of encounter  There were no vitals taken for this visit. **Patient not at home to do VS, will take later and send via My Chart  Assessment and Plan:  Pregnancy: G2P1001 at 105w0d  1. Supervision of other normal pregnancy, antepartum - Anticipatory guidance for 2 hr GTT nv - Advised to be fasting for 8 hours prior to   2. Hx of preeclampsia, prior pregnancy, currently pregnant - Continue taking bASA daily - Explained this is  to help prevent developing PEC in this pregnancy   Preterm labor symptoms and general obstetric precautions including but not limited to vaginal bleeding, contractions, leaking of fluid and fetal movement were reviewed in detail with the patient.  I discussed the assessment and treatment plan with the patient. The patient was provided an opportunity to ask questions and all were answered. The patient agreed with the plan and demonstrated an understanding of the instructions. The patient was advised to call back or seek an in-person office evaluation/go to [facility] at [facility] for any urgent or concerning symptoms. Please refer to After Visit Summary for other counseling recommendations.   I provided 5 minutes of non-face-to-face time during this encounter. There was 5 minutes of chart review time spent prior to this encounter. Total time spent = 10 minutes.  Return in about 5 weeks (around 09/29/2019) for Return OB 2hr GTT.  Future Appointments  Date Time Provider Department Center  09/28/2019  8:10 AM Judeth Horn, NP [facility] None    Raelyn Mora, CNM [facility], [facility]

## 2019-08-26 ENCOUNTER — Encounter: Payer: Self-pay | Admitting: Obstetrics and Gynecology

## 2019-08-30 ENCOUNTER — Other Ambulatory Visit: Payer: Self-pay | Admitting: *Deleted

## 2019-08-30 DIAGNOSIS — O219 Vomiting of pregnancy, unspecified: Secondary | ICD-10-CM

## 2019-08-30 MED ORDER — PROMETHAZINE HCL 25 MG PO TABS: 25.0000 mg | ORAL_TABLET | Freq: Four times a day (QID) | ORAL | 1 refills | Status: DC | PRN

## 2019-09-12 ENCOUNTER — Other Ambulatory Visit: Payer: Self-pay

## 2019-09-12 ENCOUNTER — Inpatient Hospital Stay (HOSPITAL_COMMUNITY)
Admission: AD | Admit: 2019-09-12 | Discharge: 2019-09-12 | Disposition: A | Discharge status: Home / Self Care | Payer: Medicaid Other | Attending: Obstetrics & Gynecology | Admitting: Obstetrics & Gynecology

## 2019-09-12 ENCOUNTER — Encounter (HOSPITAL_COMMUNITY): Payer: Self-pay | Admitting: Obstetrics & Gynecology

## 2019-09-12 DIAGNOSIS — Z8249 Family history of ischemic heart disease and other diseases of the circulatory system: Secondary | ICD-10-CM | POA: Diagnosis not present

## 2019-09-12 DIAGNOSIS — Z8759 Personal history of other complications of pregnancy, childbirth and the puerperium: Secondary | ICD-10-CM | POA: Insufficient documentation

## 2019-09-12 DIAGNOSIS — Z87440 Personal history of urinary (tract) infections: Secondary | ICD-10-CM | POA: Insufficient documentation

## 2019-09-12 DIAGNOSIS — R519 Headache, unspecified: Secondary | ICD-10-CM | POA: Diagnosis not present

## 2019-09-12 DIAGNOSIS — O26892 Other specified pregnancy related conditions, second trimester: Secondary | ICD-10-CM | POA: Diagnosis not present

## 2019-09-12 DIAGNOSIS — O10912 Unspecified pre-existing hypertension complicating pregnancy, second trimester: Secondary | ICD-10-CM | POA: Insufficient documentation

## 2019-09-12 DIAGNOSIS — O219 Vomiting of pregnancy, unspecified: Secondary | ICD-10-CM | POA: Diagnosis not present

## 2019-09-12 DIAGNOSIS — Z3A25 25 weeks gestation of pregnancy: Secondary | ICD-10-CM | POA: Insufficient documentation

## 2019-09-12 DIAGNOSIS — Z79899 Other long term (current) drug therapy: Secondary | ICD-10-CM | POA: Diagnosis not present

## 2019-09-12 DIAGNOSIS — Z7982 Long term (current) use of aspirin: Secondary | ICD-10-CM | POA: Diagnosis not present

## 2019-09-12 HISTORY — DX: Encounter for other specified aftercare: Z51.89

## 2019-09-12 HISTORY — DX: Unspecified infectious disease: B99.9

## 2019-09-12 LAB — COMPREHENSIVE METABOLIC PANEL
ALT: 15 U/L (ref 0–44)
AST: 14 U/L — ABNORMAL LOW (ref 15–41)
Albumin: 2.7 g/dL — ABNORMAL LOW (ref 3.5–5.0)
Alkaline Phosphatase: 88 U/L (ref 38–126)
Anion gap: 9 (ref 5–15)
BUN: 6 mg/dL (ref 6–20)
CO2: 21 mmol/L — ABNORMAL LOW (ref 22–32)
Calcium: 8.6 mg/dL — ABNORMAL LOW (ref 8.9–10.3)
Chloride: 104 mmol/L (ref 98–111)
Creatinine, Ser: 0.5 mg/dL (ref 0.44–1.00)
GFR calc Af Amer: >60 mL/min (ref >60)
GFR calc non Af Amer: >60 mL/min (ref >60)
Glucose, Bld: 90 mg/dL (ref 70–99)
Potassium: 3.3 mmol/L — ABNORMAL LOW (ref 3.5–5.1)
Sodium: 134 mmol/L — ABNORMAL LOW (ref 135–145)
Total Bilirubin: 0.4 mg/dL (ref 0.3–1.2)
Total Protein: 6.2 g/dL — ABNORMAL LOW (ref 6.5–8.1)

## 2019-09-12 LAB — URINALYSIS, ROUTINE W REFLEX MICROSCOPIC
Bilirubin Urine: NEGATIVE
Glucose, UA: NEGATIVE mg/dL
Hgb urine dipstick: NEGATIVE
Ketones, ur: NEGATIVE mg/dL
Nitrite: NEGATIVE
Protein, ur: NEGATIVE mg/dL
Specific Gravity, Urine: 1.002 — ABNORMAL LOW (ref 1.005–1.030)
pH: 6 (ref 5.0–8.0)

## 2019-09-12 LAB — CBC
HCT: 33.7 % — ABNORMAL LOW (ref 36.0–46.0)
Hemoglobin: 10.8 g/dL — ABNORMAL LOW (ref 12.0–15.0)
MCH: 27.6 pg (ref 26.0–34.0)
MCHC: 32 g/dL (ref 30.0–36.0)
MCV: 86.2 fL (ref 80.0–100.0)
Platelets: 298 10*3/uL (ref 150–400)
RBC: 3.91 MIL/uL (ref 3.87–5.11)
RDW: 13.2 % (ref 11.5–15.5)
WBC: 11.3 10*3/uL — ABNORMAL HIGH (ref 4.0–10.5)
nRBC: 0 % (ref 0.0–0.2)

## 2019-09-12 LAB — PROTEIN / CREATININE RATIO, URINE
Creatinine, Urine: 18.81 mg/dL
Total Protein, Urine: <6 mg/dL

## 2019-09-12 MED ORDER — ACETAMINOPHEN 500 MG PO TABS
1000.0000 mg | ORAL_TABLET | Freq: Once | ORAL | Status: AC
  Administered 2019-09-12: 1000 mg via ORAL
  Filled 2019-09-12: qty 2

## 2019-09-12 NOTE — Discharge Instructions (Signed)
Hypertension During Pregnancy High blood pressure (hypertension) is when the force of blood pumping through the arteries is too strong. Arteries are blood vessels that carry blood from the heart throughout the body. Hypertension during pregnancy can be mild or severe. Severe hypertension during pregnancy (preeclampsia) is a medical emergency that requires prompt evaluation and treatment. Different types of hypertension can happen during pregnancy. These include:  Chronic hypertension. This happens when you had high blood pressure before you became pregnant, and it continues during the pregnancy. Hypertension that develops before you are [redacted] weeks pregnant and continues during the pregnancy is also called chronic hypertension. If you have chronic hypertension, it will not go away after you have your baby. You will need follow-up visits with your health care provider after you have your baby. Your doctor may want you to keep taking medicine for your blood pressure.  Gestational hypertension. This is hypertension that develops after the 20th week of pregnancy. Gestational hypertension usually goes away after you have your baby, but your health care provider will need to monitor your blood pressure to make sure that it is getting better.  Preeclampsia. This is severe hypertension during pregnancy. This can cause serious complications for you and your baby and can also cause complications for you after the delivery of your baby.  Postpartum preeclampsia. You may develop severe hypertension after giving birth. This usually occurs within 48 hours after childbirth but may occur up to 6 weeks after giving birth. This is rare. How does this affect me? Women who have hypertension during pregnancy have a greater chance of developing hypertension later in life or during future pregnancies. In some cases, hypertension during pregnancy can cause serious complications, such as:  Stroke.  Heart attack.  Injury to  other organs, such as kidneys, lungs, or liver.  Preeclampsia.  Convulsions or seizures.  Placental abruption. How does this affect my baby? Hypertension during pregnancy can affect your baby. Your baby may:  Be born early (prematurely).  Not weigh as much as he or she should at birth (low birth weight).  Not tolerate labor well, leading to an unplanned cesarean delivery. What are the risks? There are certain factors that make it more likely for you to develop hypertension during pregnancy. These include:  Having hypertension during a previous pregnancy.  Being overweight.  Being age 35 or older.  Being pregnant for the first time.  Being pregnant with more than one baby.  Becoming pregnant using fertilization methods, such as IVF (in vitro fertilization).  Having other medical problems, such as diabetes, kidney disease, or lupus.  Having a family history of hypertension. What can I do to lower my risk? The exact cause of hypertension during pregnancy is not known. You may be able to lower your risk by:  Maintaining a healthy weight.  Eating a healthy and balanced diet.  Following your health care provider's instructions about treating any long-term conditions that you had before becoming pregnant. It is very important to keep all of your prenatal care appointments. Your health care provider will check your blood pressure and make sure that your pregnancy is progressing as expected. If a problem is found, early treatment can prevent complications. How is this treated? Treatment for hypertension during pregnancy varies depending on the type of hypertension you have and how serious it is.  If you were taking medicine for high blood pressure before you became pregnant, talk with your health care provider. You may need to change medicine during pregnancy because   some medicines, like ACE inhibitors, may not be considered safe for your baby.  If you have gestational  hypertension, your health care provider may order medicine to treat this during pregnancy.  If you are at risk for preeclampsia, your health care provider may recommend that you take a low-dose aspirin during your pregnancy.  If you have severe hypertension, you may need to be hospitalized so you and your baby can be monitored closely. You may also need to be given medicine to lower your blood pressure. This medicine may be given by mouth or through an IV.  In some cases, if your condition gets worse, you may need to deliver your baby early. Follow these instructions at home: Eating and drinking   Drink enough fluid to keep your urine pale yellow.  Avoid caffeine. Lifestyle  Do not use any products that contain nicotine or tobacco, such as cigarettes, e-cigarettes, and chewing tobacco. If you need help quitting, ask your health care provider.  Do not use alcohol or drugs.  Avoid stress as much as possible.  Rest and get plenty of sleep.  Regular exercise can help to reduce your blood pressure. Ask your health care provider what kinds of exercise are best for you. General instructions  Take over-the-counter and prescription medicines only as told by your health care provider.  Keep all prenatal and follow-up visits as told by your health care provider. This is important. Contact a health care provider if:  You have symptoms that your health care provider told you may require more treatment or monitoring, such as: ? Headaches. ? Nausea or vomiting. ? Abdominal pain. ? Dizziness. ? Light-headedness. Get help right away if:  You have: ? Severe abdominal pain that does not get better with treatment. ? A severe headache that does not get better. ? Vomiting that does not get better. ? Sudden, rapid weight gain. ? Sudden swelling in your hands, ankles, or face. ? Vaginal bleeding. ? Blood in your urine. ? Blurred or double vision. ? Shortness of breath or chest  pain. ? Weakness on one side of your body. ? Difficulty speaking.  Your baby is not moving as much as usual. Summary  High blood pressure (hypertension) is when the force of blood pumping through the arteries is too strong.  Hypertension during pregnancy can cause problems for you and your baby.  Treatment for hypertension during pregnancy varies depending on the type of hypertension you have and how serious it is.  Keep all prenatal and follow-up visits as told by your health care provider. This is important. This information is not intended to replace advice given to you by your health care provider. Make sure you discuss any questions you have with your health care provider. Document Revised: 07/08/2018 Document Reviewed: 04/13/2018 Elsevier Patient Education  2020 [employer] General Headache Without Cause A headache is pain or discomfort felt around the head or neck area. The specific cause of a headache may not be found. There are many causes and types of headaches. A few common ones are:  Tension headaches.  Migraine headaches.  Cluster headaches.  Chronic daily headaches. Follow these instructions at home: Watch your condition for any changes. Let your health care provider know about them. Take these steps to help with your condition: Managing pain      Take over-the-counter and prescription medicines only as told by your health care provider.  Lie down in a dark, quiet room when you have a headache.  If directed,   put ice on your head and neck area: ? Put ice in a plastic bag. ? Place a towel between your skin and the bag. ? Leave the ice on for 20 minutes, 2-3 times per day.  If directed, apply heat to the affected area. Use the heat source that your health care provider recommends, such as a moist heat pack or a heating pad. ? Place a towel between your skin and the heat source. ? Leave the heat on for 20-30 minutes. ? Remove the heat if your skin turns  bright red. This is especially important if you are unable to feel pain, heat, or cold. You may have a greater risk of getting burned.  Keep lights dim if bright lights bother you or make your headaches worse. Eating and drinking  Eat meals on a regular schedule.  If you drink alcohol: ? Limit how much you use to:  0-1 drink a day for women.  0-2 drinks a day for men. ? Be aware of how much alcohol is in your drink. In the [place] one drink equals one 12 oz bottle of beer (355 mL), one 5 oz glass of wine (148 mL), or one 1 oz glass of hard liquor (44 mL).  Stop drinking caffeine, or decrease the amount of caffeine you drink. General instructions   Keep a headache journal to help find out what may trigger your headaches. For example, write down: ? What you eat and drink. ? How much sleep you get. ? Any change to your diet or medicines.  Try massage or other relaxation techniques.  Limit stress.  Sit up straight, and do not tense your muscles.  Do not use any products that contain nicotine or tobacco, such as cigarettes, e-cigarettes, and chewing tobacco. If you need help quitting, ask your health care provider.  Exercise regularly as told by your health care provider.  Sleep on a regular schedule. Get 7-9 hours of sleep each night, or the amount recommended by your health care provider.  Keep all follow-up visits as told by your health care provider. This is important. Contact a health care provider if:  Your symptoms are not helped by medicine.  You have a headache that is different from the usual headache.  You have nausea or you vomit.  You have a fever. Get help right away if:  Your headache becomes severe quickly.  Your headache gets worse after moderate to intense physical activity.  You have repeated vomiting.  You have a stiff neck.  You have a loss of vision.  You have problems with speech.  You have pain in the eye or ear.  You have muscular  weakness or loss of muscle control.  You lose your balance or have trouble walking.  You feel faint or pass out.  You have confusion.  You have a seizure. Summary  A headache is pain or discomfort felt around the head or neck area.  There are many causes and types of headaches. In some cases, the cause may not be found.  Keep a headache journal to help find out what may trigger your headaches. Watch your condition for any changes. Let your health care provider know about them.  Contact a health care provider if you have a headache that is different from the usual headache, or if your symptoms are not helped by medicine.  Get help right away if your headache becomes severe, you vomit, you have a loss of vision, you lose your balance, or   you have a seizure. This information is not intended to replace advice given to you by your health care provider. Make sure you discuss any questions you have with your health care provider. Document Revised: 10/05/2017 Document Reviewed: 10/05/2017 Elsevier Patient Education  2020 [employer]  

## 2019-09-12 NOTE — MAU Note (Signed)
Woke up with a HA today.  Monitors BP, hx of PRE-E.  BP was elevated. Called her nurse and was instructed to come here. Did not take anything for HA.  Denies visual changes, epigastric pain or increase in swelling.

## 2019-09-12 NOTE — MAU Provider Note (Signed)
Chief Complaint:  Headache and Hypertension   First Provider Initiated Contact with Patient 09/12/19 1001     HPI: Nicole Lane is a 24 y.o. G2P1001 at [redacted]w[redacted]d who presents to maternity admissions reporting headache & high blood pressure. Symptoms started this morning while at work. Reports frontal headache that she rates 6/10. Hasn't treated symptoms. Pain doesn't radiate. Gradual onset. Nothing makes better or worse. History of preeclampsia in previous pregnancy. Took BP at work using wrist cuff with elevation in BP (138/85 & 164/110). Denies visual disturbance or epigastric pain. No other complaints. Good fetal movement.   Pregnancy Course: [facility]. Hx preeclampsia.   Past Medical History:  Diagnosis Date  . Blood transfusion without reported diagnosis    PPH  . Infection    UTI  . Pregnancy induced hypertension    OB History  Gravida Para Term Preterm AB Living  2 1 1  0   1  SAB TAB Ectopic Multiple Live Births          1    # Outcome Date GA Lbr Len/2nd Weight Sex Delivery Anes PTL Lv  2 Current           1 Term 08/06/15 [redacted]w[redacted]d  2807 g M Vag-Spont EPI N LIV     Complications: History of blood transfusion   Past Surgical History:  Procedure Laterality Date  . NO PAST SURGERIES     Family History  Problem Relation Age of Onset  . Cancer Father   . Hypertension Father    Social History   Tobacco Use  . Smoking status: Never Smoker  . Smokeless tobacco: Never Used  Vaping Use  . Vaping Use: Never used  Substance Use Topics  . Alcohol use: Never  . Drug use: Never   No Known Allergies Medications Prior to Admission  Medication Sig Dispense Refill Last Dose  . aspirin EC 81 MG tablet Take 1 tablet (81 mg total) by mouth daily. 30 tablet 4 09/11/2019 at Unknown time  . Prenatal Vit-Fe Fumarate-FA (MULTIVITAMIN-PRENATAL) 27-0.8 MG TABS tablet Take 1 tablet by mouth daily at 12 noon.   09/12/2019 at Unknown time  . promethazine (PHENERGAN) 25 MG tablet Take 1 tablet  (25 mg total) by mouth every 6 (six) hours as needed for nausea or vomiting. 30 tablet 1 09/12/2019 at 0630    I have reviewed patient's Past Medical Hx, Surgical Hx, Family Hx, Social Hx, medications and allergies.   ROS:  Review of Systems  Constitutional: Negative.   Eyes: Negative for visual disturbance.  Gastrointestinal: Negative.   Genitourinary: Negative.   Neurological: Positive for headaches.    Physical Exam   Patient Vitals for the past 24 hrs:  BP Temp Pulse Resp SpO2 Height Weight  09/12/19 1000 107/72 -- 100 -- -- -- --  09/12/19 0945 108/73 -- 94 -- -- -- --  09/12/19 0914 121/78 98.5 F (36.9 C) (!) 118 17 99 % 5\' 6"  (1.676 m) 92.1 kg    Constitutional: Well-developed, well-nourished female in no acute distress.  Cardiovascular: normal rate & rhythm, no murmur Respiratory: normal effort, lung sounds clear throughout GI: Abd soft, non-tender, gravid appropriate for gestational age. Pos BS x 4 MS: Extremities nontender, no edema, normal ROM Neurologic: Alert and oriented x 4.   Fetal Tracing:  Baseline: 144 Variability: moderate Accelerations: 10x10 Decelerations:none  Toco: none    Labs: Results for orders placed or performed during the hospital encounter of 09/12/19 (from the past 24 hour(s))  Urinalysis, Routine  w reflex microscopic     Status: Abnormal   Collection Time: 09/12/19 10:22 AM  Result Value Ref Range   Color, Urine STRAW (A) YELLOW   APPearance CLEAR CLEAR   Specific Gravity, Urine 1.002 (L) 1.005 - 1.030   pH 6.0 5.0 - 8.0   Glucose, UA NEGATIVE NEGATIVE mg/dL   Hgb urine dipstick NEGATIVE NEGATIVE   Bilirubin Urine NEGATIVE NEGATIVE   Ketones, ur NEGATIVE NEGATIVE mg/dL   Protein, ur NEGATIVE NEGATIVE mg/dL   Nitrite NEGATIVE NEGATIVE   Leukocytes,Ua TRACE (A) NEGATIVE   RBC / HPF 0-5 0 - 5 RBC/hpf   WBC, UA 0-5 0 - 5 WBC/hpf   Bacteria, UA RARE (A) NONE SEEN   Squamous Epithelial / LPF 0-5 0 - 5  Protein / creatinine ratio,  urine     Status: None   Collection Time: 09/12/19 10:23 AM  Result Value Ref Range   Creatinine, Urine 18.81 mg/dL   Total Protein, Urine <6 mg/dL   Protein Creatinine Ratio        0.00 - 0.15 mg/mg[Cre]  CBC     Status: Abnormal   Collection Time: 09/12/19 11:11 AM  Result Value Ref Range   WBC 11.3 (H) 4.0 - 10.5 K/uL   RBC 3.91 3.87 - 5.11 MIL/uL   Hemoglobin 10.8 (L) 12.0 - 15.0 g/dL   HCT 82.8 (L) 36 - 46 %   MCV 86.2 80.0 - 100.0 fL   MCH 27.6 26.0 - 34.0 pg   MCHC 32.0 30.0 - 36.0 g/dL   RDW 00.3 49.1 - 79.1 %   Platelets 298 150 - 400 K/uL   nRBC 0.0 0.0 - 0.2 %  Comprehensive metabolic panel     Status: Abnormal   Collection Time: 09/12/19 11:11 AM  Result Value Ref Range   Sodium 134 (L) 135 - 145 mmol/L   Potassium 3.3 (L) 3.5 - 5.1 mmol/L   Chloride 104 98 - 111 mmol/L   CO2 21 (L) 22 - 32 mmol/L   Glucose, Bld 90 70 - 99 mg/dL   BUN 6 6 - 20 mg/dL   Creatinine, Ser 5.05 0.44 - 1.00 mg/dL   Calcium 8.6 (L) 8.9 - 10.3 mg/dL   Total Protein 6.2 (L) 6.5 - 8.1 g/dL   Albumin 2.7 (L) 3.5 - 5.0 g/dL   AST 14 (L) 15 - 41 U/L   ALT 15 0 - 44 U/L   Alkaline Phosphatase 88 38 - 126 U/L   Total Bilirubin 0.4 0.3 - 1.2 mg/dL   GFR calc non Af Amer >60 >60 mL/min   GFR calc Af Amer >60 >60 mL/min   Anion gap 9 5 - 15    Imaging:  No results found.  MAU Course: Orders Placed This Encounter  Procedures  . Urinalysis, Routine w reflex microscopic  . CBC  . Comprehensive metabolic panel  . Protein / creatinine ratio, urine  . Discharge patient   Meds ordered this encounter  Medications  . acetaminophen (TYLENOL) tablet 1,000 mg    MDM: Normotensive in MAU but reports elevated BPs while at work. Preeclampsia labs done & are normal.  Headache resolved with 1 gm of tylenol  Assessment: 1. Headache in pregnancy, antepartum, second trimester   2. [redacted] weeks gestation of pregnancy   3. Nausea and vomiting of pregnancy, antepartum     Plan: Discharge home in  stable condition.  HTN precautions Tylenol prn headaches    Allergies as of 09/12/2019  No Known Allergies     Medication List    TAKE these medications   aspirin EC 81 MG tablet Take 1 tablet (81 mg total) by mouth daily.   multivitamin-prenatal 27-0.8 MG Tabs tablet Take 1 tablet by mouth daily at 12 noon.   promethazine 25 MG tablet Commonly known as: PHENERGAN Take 1 tablet (25 mg total) by mouth every 6 (six) hours as needed for nausea or vomiting.       Judeth Horn, NP 09/12/2019 12:27 PM

## 2019-09-28 ENCOUNTER — Encounter: Payer: Self-pay | Admitting: General Practice

## 2019-09-28 ENCOUNTER — Other Ambulatory Visit: Payer: Self-pay

## 2019-09-28 ENCOUNTER — Ambulatory Visit (INDEPENDENT_AMBULATORY_CARE_PROVIDER_SITE_OTHER): Payer: Medicaid Other | Admitting: Student

## 2019-09-28 ENCOUNTER — Encounter: Payer: Self-pay | Admitting: Student

## 2019-09-28 VITALS — BP 113/71 | HR 101 | Temp 98.1°F | Wt 203.0 lb

## 2019-09-28 DIAGNOSIS — Z3A27 27 weeks gestation of pregnancy: Secondary | ICD-10-CM

## 2019-09-28 DIAGNOSIS — Z23 Encounter for immunization: Secondary | ICD-10-CM

## 2019-09-28 DIAGNOSIS — Z3482 Encounter for supervision of other normal pregnancy, second trimester: Secondary | ICD-10-CM | POA: Diagnosis not present

## 2019-09-28 DIAGNOSIS — Z3480 Encounter for supervision of other normal pregnancy, unspecified trimester: Secondary | ICD-10-CM

## 2019-09-28 NOTE — Progress Notes (Signed)
   PRENATAL VISIT NOTE  Subjective:  Nicole Lane is a 24 y.o. G2P1001 at [redacted]w[redacted]d being seen today for ongoing prenatal care.  She is currently monitored for the following issues for this low-risk pregnancy and has Supervision of other normal pregnancy, antepartum and Nausea and vomiting of pregnancy, antepartum on their problem list.  Patient reports no complaints.  Contractions: Not present. Vag. Bleeding: None.  Movement: Present. Denies leaking of fluid.   The following portions of the patient's history were reviewed and updated as appropriate: allergies, current medications, past family history, past medical history, past social history, past surgical history and problem list.   Objective:   Vitals:   09/28/19 0817  BP: 113/71  Pulse: (!) 101  Temp: 98.1 F (36.7 C)  Weight: 203 lb (92.1 kg)    Fetal Status: Fetal Heart Rate (bpm): 143 Fundal Height: 26 cm Movement: Present     General:  Alert, oriented and cooperative. Patient is in no acute distress.  Skin: Skin is warm and dry. No rash noted.   Cardiovascular: Normal heart rate noted  Respiratory: Normal respiratory effort, no problems with respiration noted  Abdomen: Soft, gravid, appropriate for gestational age.  Pain/Pressure: Absent     Pelvic: Cervical exam deferred        Extremities: Normal range of motion.  Edema: None  Mental Status: Normal mood and affect. Normal behavior. Normal judgment and thought content.   Assessment and Plan:  Pregnancy: G2P1001 at [redacted]w[redacted]d 1. Supervision of other normal pregnancy, antepartum -No longer interested in BTL. Previously used mirena but doesn't want that due to amenorrhea. Still wants regular periods. Discussed pills vs ring.   - Glucose Tolerance, 2 Hours w/1 Hour - HIV Antibody (routine testing w rflx) - RPR - CBC - Tdap vaccine greater than or equal to 7yo IM  2. Need for tetanus, diphtheria, and acellular pertussis (Tdap) vaccine in patient of adolescent age or  older  - Tdap vaccine greater than or equal to 7yo IM  Preterm labor symptoms and general obstetric precautions including but not limited to vaginal bleeding, contractions, leaking of fluid and fetal movement were reviewed in detail with the patient. Please refer to After Visit Summary for other counseling recommendations.   Return in about 2 weeks (around 10/12/2019) for Routine OB, vitual.  No future appointments.  Judeth Horn, NP

## 2019-09-28 NOTE — Patient Instructions (Signed)
Third Trimester of Pregnancy The third trimester is from week 28 through week 40 (months 7 through 9). The third trimester is a time when the unborn baby (fetus) is growing rapidly. At the end of the ninth month, the fetus is about 20 inches in length and weighs 6-10 pounds. Body changes during your third trimester Your body will continue to go through many changes during pregnancy. The changes vary from woman to woman. During the third trimester:  Your weight will continue to increase. You can expect to gain 25-35 pounds (11-16 kg) by the end of the pregnancy.  You may begin to get stretch marks on your hips, abdomen, and breasts.  You may urinate more often because the fetus is moving lower into your pelvis and pressing on your bladder.  You may develop or continue to have heartburn. This is caused by increased hormones that slow down muscles in the digestive tract.  You may develop or continue to have constipation because increased hormones slow digestion and cause the muscles that push waste through your intestines to relax.  You may develop hemorrhoids. These are swollen veins (varicose veins) in the rectum that can itch or be painful.  You may develop swollen, bulging veins (varicose veins) in your legs.  You may have increased body aches in the pelvis, back, or thighs. This is due to weight gain and increased hormones that are relaxing your joints.  You may have changes in your hair. These can include thickening of your hair, rapid growth, and changes in texture. Some women also have hair loss during or after pregnancy, or hair that feels dry or thin. Your hair will most likely return to normal after your baby is born.  Your breasts will continue to grow and they will continue to become tender. A yellow fluid (colostrum) may leak from your breasts. This is the first milk you are producing for your baby.  Your belly button may stick out.  You may notice more swelling in your hands,  face, or ankles.  You may have increased tingling or numbness in your hands, arms, and legs. The skin on your belly may also feel numb.  You may feel short of breath because of your expanding uterus.  You may have more problems sleeping. This can be caused by the size of your belly, increased need to urinate, and an increase in your body's metabolism.  You may notice the fetus "dropping," or moving lower in your abdomen (lightening).  You may have increased vaginal discharge.  You may notice your joints feel loose and you may have pain around your pelvic bone. What to expect at prenatal visits You will have prenatal exams every 2 weeks until week 36. Then you will have weekly prenatal exams. During a routine prenatal visit:  You will be weighed to make sure you and the baby are growing normally.  Your blood pressure will be taken.  Your abdomen will be measured to track your baby's growth.  The fetal heartbeat will be listened to.  Any test results from the previous visit will be discussed.  You may have a cervical check near your due date to see if your cervix has softened or thinned (effaced).  You will be tested for Group B streptococcus. This happens between 35 and 37 weeks. Your health care provider may ask you:  What your birth plan is.  How you are feeling.  If you are feeling the baby move.  If you have had any abnormal   symptoms, such as leaking fluid, bleeding, severe headaches, or abdominal cramping.  If you are using any tobacco products, including cigarettes, chewing tobacco, and electronic cigarettes.  If you have any questions. Other tests or screenings that may be performed during your third trimester include:  Blood tests that check for low iron levels (anemia).  Fetal testing to check the health, activity level, and growth of the fetus. Testing is done if you have certain medical conditions or if there are problems during the pregnancy.  Nonstress test  (NST). This test checks the health of your baby to make sure there are no signs of problems, such as the baby not getting enough oxygen. During this test, a belt is placed around your belly. The baby is made to move, and its heart rate is monitored during movement. What is false labor? False labor is a condition in which you feel small, irregular tightenings of the muscles in the womb (contractions) that usually go away with rest, changing position, or drinking water. These are called Braxton Hicks contractions. Contractions may last for hours, days, or even weeks before true labor sets in. If contractions come at regular intervals, become more frequent, increase in intensity, or become painful, you should see your health care provider. What are the signs of labor?  Abdominal cramps.  Regular contractions that start at 10 minutes apart and become stronger and more frequent with time.  Contractions that start on the top of the uterus and spread down to the lower abdomen and back.  Increased pelvic pressure and dull back pain.  A watery or bloody mucus discharge that comes from the vagina.  Leaking of amniotic fluid. This is also known as your "water breaking." It could be a slow trickle or a gush. Let your health care provider know if it has a color or strange odor. If you have any of these signs, call your health care provider right away, even if it is before your due date. Follow these instructions at home: Medicines  Follow your health care provider's instructions regarding medicine use. Specific medicines may be either safe or unsafe to take during pregnancy.  Take a prenatal vitamin that contains at least 600 micrograms (mcg) of folic acid.  If you develop constipation, try taking a stool softener if your health care provider approves. Eating and drinking   Eat a balanced diet that includes fresh fruits and vegetables, whole grains, good sources of protein such as meat, eggs, or tofu,  and low-fat dairy. Your health care provider will help you determine the amount of weight gain that is right for you.  Avoid raw meat and uncooked cheese. These carry germs that can cause birth defects in the baby.  If you have low calcium intake from food, talk to your health care provider about whether you should take a daily calcium supplement.  Eat four or five small meals rather than three large meals a day.  Limit foods that are high in fat and processed sugars, such as fried and sweet foods.  To prevent constipation: ? Drink enough fluid to keep your urine clear or pale yellow. ? Eat foods that are high in fiber, such as fresh fruits and vegetables, whole grains, and beans. Activity  Exercise only as directed by your health care provider. Most women can continue their usual exercise routine during pregnancy. Try to exercise for 30 minutes at least 5 days a week. Stop exercising if you experience uterine contractions.  Avoid heavy lifting.  Do   not exercise in extreme heat or humidity, or at high altitudes.  Wear low-heel, comfortable shoes.  Practice good posture.  You may continue to have sex unless your health care provider tells you otherwise. Relieving pain and discomfort  Take frequent breaks and rest with your legs elevated if you have leg cramps or low back pain.  Take warm sitz baths to soothe any pain or discomfort caused by hemorrhoids. Use hemorrhoid cream if your health care provider approves.  Wear a good support bra to prevent discomfort from breast tenderness.  If you develop varicose veins: ? Wear support pantyhose or compression stockings as told by your healthcare provider. ? Elevate your feet for 15 minutes, 3-4 times a day. Prenatal care  Write down your questions. Take them to your prenatal visits.  Keep all your prenatal visits as told by your health care provider. This is important. Safety  Wear your seat belt at all times when driving.  Make  a list of emergency phone numbers, including numbers for family, friends, the hospital, and police and fire departments. General instructions  Avoid cat litter boxes and soil used by cats. These carry germs that can cause birth defects in the baby. If you have a cat, ask someone to clean the litter box for you.  Do not travel far distances unless it is absolutely necessary and only with the approval of your health care provider.  Do not use hot tubs, steam rooms, or saunas.  Do not drink alcohol.  Do not use any products that contain nicotine or tobacco, such as cigarettes and e-cigarettes. If you need help quitting, ask your health care provider.  Do not use any medicinal herbs or unprescribed drugs. These chemicals affect the formation and growth of the baby.  Do not douche or use tampons or scented sanitary pads.  Do not cross your legs for long periods of time.  To prepare for the arrival of your baby: ? Take prenatal classes to understand, practice, and ask questions about labor and delivery. ? Make a trial run to the hospital. ? Visit the hospital and tour the maternity area. ? Arrange for maternity or paternity leave through employers. ? Arrange for family and friends to take care of pets while you are in the hospital. ? Purchase a rear-facing car seat and make sure you know how to install it in your car. ? Pack your hospital bag. ? Prepare the baby's nursery. Make sure to remove all pillows and stuffed animals from the baby's crib to prevent suffocation.  Visit your dentist if you have not gone during your pregnancy. Use a soft toothbrush to brush your teeth and be gentle when you floss. Contact a health care provider if:  You are unsure if you are in labor or if your water has broken.  You become dizzy.  You have mild pelvic cramps, pelvic pressure, or nagging pain in your abdominal area.  You have lower back pain.  You have persistent nausea, vomiting, or  diarrhea.  You have an unusual or bad smelling vaginal discharge.  You have pain when you urinate. Get help right away if:  Your water breaks before 37 weeks.  You have regular contractions less than 5 minutes apart before 37 weeks.  You have a fever.  You are leaking fluid from your vagina.  You have spotting or bleeding from your vagina.  You have severe abdominal pain or cramping.  You have rapid weight loss or weight gain.  You have   shortness of breath with chest pain.  You notice sudden or extreme swelling of your face, hands, ankles, feet, or legs.  Your baby makes fewer than 10 movements in 2 hours.  You have severe headaches that do not go away when you take medicine.  You have vision changes. Summary  The third trimester is from week 28 through week 40, months 7 through 9. The third trimester is a time when the unborn baby (fetus) is growing rapidly.  During the third trimester, your discomfort may increase as you and your baby continue to gain weight. You may have abdominal, leg, and back pain, sleeping problems, and an increased need to urinate.  During the third trimester your breasts will keep growing and they will continue to become tender. A yellow fluid (colostrum) may leak from your breasts. This is the first milk you are producing for your baby.  False labor is a condition in which you feel small, irregular tightenings of the muscles in the womb (contractions) that eventually go away. These are called Braxton Hicks contractions. Contractions may last for hours, days, or even weeks before true labor sets in.  Signs of labor can include: abdominal cramps; regular contractions that start at 10 minutes apart and become stronger and more frequent with time; watery or bloody mucus discharge that comes from the vagina; increased pelvic pressure and dull back pain; and leaking of amniotic fluid. This information is not intended to replace advice given to you by your  health care provider. Make sure you discuss any questions you have with your health care provider. Document Revised: 07/08/2018 Document Reviewed: 04/22/2016 Elsevier Patient Education  2020 [employer]  

## 2019-09-29 LAB — RPR: RPR Ser Ql: NONREACTIVE

## 2019-09-29 LAB — CBC
Hematocrit: 33.8 % — ABNORMAL LOW (ref 34.0–46.6)
Hemoglobin: 11.1 g/dL (ref 11.1–15.9)
MCH: 27.5 pg (ref 26.6–33.0)
MCHC: 32.8 g/dL (ref 31.5–35.7)
MCV: 84 fL (ref 79–97)
Platelets: 285 10*3/uL (ref 150–450)
RBC: 4.04 x10E6/uL (ref 3.77–5.28)
RDW: 13.6 % (ref 11.7–15.4)
WBC: 10.2 10*3/uL (ref 3.4–10.8)

## 2019-09-29 LAB — GLUCOSE TOLERANCE, 2 HOURS W/ 1HR
Glucose, 1 hour: 153 mg/dL (ref 65–179)
Glucose, 2 hour: 92 mg/dL (ref 65–152)
Glucose, Fasting: 74 mg/dL (ref 65–91)

## 2019-09-29 LAB — HIV ANTIBODY (ROUTINE TESTING W REFLEX): HIV Screen 4th Generation wRfx: NONREACTIVE

## 2019-10-10 ENCOUNTER — Other Ambulatory Visit: Payer: Self-pay | Admitting: *Deleted

## 2019-10-10 DIAGNOSIS — O219 Vomiting of pregnancy, unspecified: Secondary | ICD-10-CM

## 2019-10-10 MED ORDER — PROMETHAZINE HCL 25 MG PO TABS: 25.0000 mg | ORAL_TABLET | Freq: Four times a day (QID) | ORAL | 1 refills | Status: AC | PRN

## 2019-10-10 NOTE — Progress Notes (Signed)
Patient request refill of phenergan.  Clovis Pu, RN

## 2019-10-13 ENCOUNTER — Encounter: Payer: Self-pay | Admitting: General Practice

## 2019-10-13 ENCOUNTER — Telehealth (INDEPENDENT_AMBULATORY_CARE_PROVIDER_SITE_OTHER): Payer: Medicaid Other | Admitting: Advanced Practice Midwife

## 2019-10-13 VITALS — BP 117/76

## 2019-10-13 DIAGNOSIS — M549 Dorsalgia, unspecified: Secondary | ICD-10-CM

## 2019-10-13 DIAGNOSIS — R12 Heartburn: Secondary | ICD-10-CM

## 2019-10-13 DIAGNOSIS — O26893 Other specified pregnancy related conditions, third trimester: Secondary | ICD-10-CM

## 2019-10-13 DIAGNOSIS — Z3A30 30 weeks gestation of pregnancy: Secondary | ICD-10-CM

## 2019-10-13 DIAGNOSIS — O219 Vomiting of pregnancy, unspecified: Secondary | ICD-10-CM

## 2019-10-13 DIAGNOSIS — O99891 Other specified diseases and conditions complicating pregnancy: Secondary | ICD-10-CM

## 2019-10-13 DIAGNOSIS — Z3480 Encounter for supervision of other normal pregnancy, unspecified trimester: Secondary | ICD-10-CM

## 2019-10-13 MED ORDER — OMEPRAZOLE 10 MG PO CPDR: 10.0000 mg | DELAYED_RELEASE_CAPSULE | Freq: Every day | ORAL | 3 refills | Status: AC

## 2019-10-13 MED ORDER — MISC. DEVICES MISC: 0 refills | Status: DC

## 2019-10-13 MED ORDER — CYCLOBENZAPRINE HCL 10 MG PO TABS: 10.0000 mg | ORAL_TABLET | Freq: Three times a day (TID) | ORAL | 2 refills | Status: DC | PRN

## 2019-10-13 NOTE — Patient Instructions (Addendum)
PREGNANCY SUPPORT BELT: You are not alone, Seventy-five percent of women have some sort of abdominal or back pain at some point in their pregnancy. Your baby is growing at a fast pace, which means that your whole body is rapidly trying to adjust to the changes. As your uterus grows, your back may start feeling a bit under stress and this can result in back or abdominal pain that can go from mild, and therefore bearable, to severe pains that will not allow you to sit or lay down comfortably, When it comes to dealing with pregnancy-related pains and cramps, some pregnant women usually prefer natural remedies, which the market is filled with nowadays. For example, wearing a pregnancy support belt can help ease and lessen your discomfort and pain. WHAT ARE THE BENEFITS OF WEARING A PREGNANCY SUPPORT BELT? A pregnancy support belt provides support to the lower portion of the belly taking some of the weight of the growing uterus and distributing to the other parts of your body. It is designed make you comfortable and gives you extra support. Over the years, the pregnancy apparel market has been studying the needs and wants of pregnant women and they have come up with the most comfortable pregnancy support belts that woman could ever ask for. In fact, you will no longer have to wear a stretched-out or bulky pregnancy belt that is visible underneath your clothes and makes you feel even more uncomfortable. Nowadays, a pregnancy support belt is made of comfortable and stretchy materials that will not irritate your skin but will actually make you feel at ease and you will not even notice you are wearing it. They are easy to put on and adjust during the day and can be worn at night for additional support.  BENEFITS: . Relives Back pain . Relieves Abdominal Muscle and Leg Pain . Stabilizes the Pelvic Ring . Offers a Cushioned Abdominal Lift Pad . Relieves pressure on the Sciatic Nerve Within Minutes WHERE TO GET  YOUR PREGNANCY BELT: [employer] [phone] @[phone]  [address] [place], [place]  Back Pain in Pregnancy Back pain during pregnancy is common. Back pain may be caused by several factors that are related to changes during your pregnancy. Follow these instructions at home: Managing pain, stiffness, and swelling      If directed, for sudden (acute) back pain, put ice on the painful area. ? Put ice in a plastic bag. ? Place a towel between your skin and the bag. ? Leave the ice on for 20 minutes, 2-3 times per day.  If directed, apply heat to the affected area before you exercise. Use the heat source that your health care provider recommends, such as a moist heat pack or a heating pad. ? Place a towel between your skin and the heat source. ? Leave the heat on for 20-30 minutes. ? Remove the heat if your skin turns bright red. This is especially important if you are unable to feel pain, heat, or cold. You may have a greater risk of getting burned.  If directed, massage the affected area. Activity  Exercise as told by your health care provider. Gentle exercise is the best way to prevent or manage back pain.  Listen to your body when lifting. If lifting hurts, ask for help or bend your knees. This uses your leg muscles instead of your back muscles.  Squat down when picking up something from the floor. Do not bend over.  Only use bed rest  for short periods as told by your health care provider. Bed rest should only be used for the most severe episodes of back pain. Standing, sitting, and lying down  Do not stand in one place for long periods of time.  Use good posture when sitting. Make sure your head rests over your shoulders and is not hanging forward. Use a pillow on your lower back if necessary.  Try sleeping on your side, preferably the left side, with a pregnancy support pillow or 1-2 regular pillows between your legs. ? If you have back pain after a  night's rest, your bed may be too soft. ? A firm mattress may provide more support for your back during pregnancy. General instructions  Do not wear high heels.  Eat a healthy diet. Try to gain weight within your health care provider's recommendations.  Use a maternity girdle, elastic sling, or back brace as told by your health care provider.  Take over-the-counter and prescription medicines only as told by your health care provider.  Work with a physical therapist or massage therapist to find ways to manage back pain. Acupuncture or massage therapy may be helpful.  Keep all follow-up visits as told by your health care provider. This is important. Contact a health care provider if:  Your back pain interferes with your daily activities.  You have increasing pain in other parts of your body. Get help right away if:  You develop numbness, tingling, weakness, or problems with the use of your arms or legs.  You develop severe back pain that is not controlled with medicine.  You have a change in bowel or bladder control.  You develop shortness of breath, dizziness, or you faint.  You develop nausea, vomiting, or sweating.  You have back pain that is a rhythmic, cramping pain similar to labor pains. Labor pain is usually 1-2 minutes apart, lasts for about 1 minute, and involves a bearing down feeling or pressure in your pelvis.  You have back pain and your water breaks or you have vaginal bleeding.  You have back pain or numbness that travels down your leg.  Your back pain developed after you fell.  You develop pain on one side of your back.  You see blood in your urine.  You develop skin blisters in the area of your back pain. Summary  Back pain may be caused by several factors that are related to changes during your pregnancy.  Follow instructions as told by your health care provider for managing pain, stiffness, and swelling.  Exercise as told by your health care  provider. Gentle exercise is the best way to prevent or manage back pain.  Take over-the-counter and prescription medicines only as told by your health care provider.  Keep all follow-up visits as told by your health care provider. This is important. This information is not intended to replace advice given to you by your health care provider. Make sure you discuss any questions you have with your health care provider. Document Revised: 07/06/2018 Document Reviewed: 09/02/2017 Elsevier Patient Education  2020 [employer].   Food Choices for Gastroesophageal Reflux Disease, Adult When you have gastroesophageal reflux disease (GERD), the foods you eat and your eating habits are very important. Choosing the right foods can help ease your discomfort. Think about working with a nutrition specialist (dietitian) to help you make good choices. What are tips for following this plan?  Meals  Choose healthy foods that are low in fat, such as fruits, vegetables, whole  grains, low-fat dairy products, and lean meat, fish, and poultry.  Eat small meals often instead of 3 large meals a day. Eat your meals slowly, and in a place where you are relaxed. Avoid bending over or lying down until 2-3 hours after eating.  Avoid eating meals 2-3 hours before bed.  Avoid drinking a lot of liquid with meals.  Cook foods using methods other than frying. Bake, grill, or broil food instead.  Avoid or limit: ? Chocolate. ? Peppermint or spearmint. ? Alcohol. ? Pepper. ? Black and decaffeinated coffee. ? Black and decaffeinated tea. ? Bubbly (carbonated) soft drinks. ? Caffeinated energy drinks and soft drinks.  Limit high-fat foods such as: ? Fatty meat or fried foods. ? Whole milk, cream, butter, or ice cream. ? Nuts and nut butters. ? Pastries, donuts, and sweets made with butter or shortening.  Avoid foods that cause symptoms. These foods may be different for everyone. Common foods that cause symptoms  include: ? Tomatoes. ? Oranges, lemons, and limes. ? Peppers. ? Spicy food. ? Onions and garlic. ? Vinegar. Lifestyle  Maintain a healthy weight. Ask your doctor what weight is healthy for you. If you need to lose weight, work with your doctor to do so safely.  Exercise for at least 30 minutes for 5 or more days each week, or as told by your doctor.  Wear loose-fitting clothes.  Do not smoke. If you need help quitting, ask your doctor.  Sleep with the head of your bed higher than your feet. Use a wedge under the mattress or blocks under the bed frame to raise the head of the bed. Summary  When you have gastroesophageal reflux disease (GERD), food and lifestyle choices are very important in easing your symptoms.  Eat small meals often instead of 3 large meals a day. Eat your meals slowly, and in a place where you are relaxed.  Limit high-fat foods such as fatty meat or fried foods.  Avoid bending over or lying down until 2-3 hours after eating.  Avoid peppermint and spearmint, caffeine, alcohol, and chocolate. This information is not intended to replace advice given to you by your health care provider. Make sure you discuss any questions you have with your health care provider. Document Revised: 07/08/2018 Document Reviewed: 04/22/2016 Elsevier Patient Education  2020 [employer].

## 2019-10-13 NOTE — Progress Notes (Signed)
OBSTETRICS PRENATAL VIRTUAL VISIT ENCOUNTER NOTE  Provider location: [facility] for [facility]   I connected with Nicole Lane on 10/13/19 at 11:30 AM EDT by MyChart Video Encounter at home and verified that I am speaking with the correct person using two identifiers.   I discussed the limitations, risks, security and privacy concerns of performing an evaluation and management service virtually and the availability of in person appointments. I also discussed with the patient that there may be a patient responsible charge related to this service. The patient expressed understanding and agreed to proceed. Subjective:  Nicole Lane is a 24 y.o. G2P1001 at [redacted]w[redacted]d being seen today for ongoing prenatal care.  She is currently monitored for the following issues for this low-risk pregnancy and has Supervision of other normal pregnancy, antepartum and Nausea and vomiting of pregnancy, antepartum on their problem list.  Patient reports recurrent low back pain. She sits in a chair for the majority of her shift, which can be for up to eight hours. She is requesting a work note enforcing new part time work schedule. Contractions: Not present. Vag. Bleeding: None.  Movement: Present. Denies any leaking of fluid.   Patient also c/o new onset heartburn and requests medication to assist with management.  The following portions of the patient's history were reviewed and updated as appropriate: allergies, current medications, past family history, past medical history, past social history, past surgical history and problem list.   Objective:   Vitals:   10/13/19 1130  BP: 117/76    Fetal Status:     Movement: Present     General:  Alert, oriented and cooperative. Patient is in no acute distress.  Respiratory: Normal respiratory effort, no problems with respiration noted  Mental Status: Normal mood and affect. Normal behavior. Normal judgment and thought content.  Rest of  physical exam deferred due to type of encounter  Imaging: No results found.  Assessment and Plan:  Pregnancy: G2P1001 at [redacted]w[redacted]d 1. Supervision of other normal pregnancy, antepartum - Routine care  2. Nausea and vomiting of pregnancy, antepartum   3. Heartburn during pregnancy in third trimester  - omeprazole (PRILOSEC) 10 MG capsule; Take 1 capsule (10 mg total) by mouth daily.  Dispense: 30 capsule; Refill: 3  4. Back pain affecting pregnancy in third trimester - Discussed back pain as typical complaint in third trimester. Patient does not have medical indication to be removed from full time to part time work - Consider stretch breaks during the day - Offered and submitted work restriction note - cyclobenzaprine (FLEXERIL) 10 MG tablet; Take 1 tablet (10 mg total) by mouth 3 (three) times daily as needed for muscle spasms.  Dispense: 30 tablet; Refill: 2 - Misc. Devices MISC; Dispense one maternity belt for patient  Dispense: 1 each; Refill: 0  Preterm labor symptoms and general obstetric precautions including but not limited to vaginal bleeding, contractions, leaking of fluid and fetal movement were reviewed in detail with the patient. I discussed the assessment and treatment plan with the patient. The patient was provided an opportunity to ask questions and all were answered. The patient agreed with the plan and demonstrated an understanding of the instructions. The patient was advised to call back or seek an in-person office evaluation/go to [facility] at [facility] for any urgent or concerning symptoms. Please refer to After Visit Summary for other counseling recommendations.   I provided ten minutes of face-to-face time during this encounter.    Future Appointments  Date Time Provider Department Center  10/28/2019 11:30 AM Gerrit Heck, CNM [facility] None    Calvert Cantor, CNM [facility] for [employer]

## 2019-10-28 ENCOUNTER — Other Ambulatory Visit: Payer: Self-pay

## 2019-10-28 ENCOUNTER — Ambulatory Visit (INDEPENDENT_AMBULATORY_CARE_PROVIDER_SITE_OTHER): Payer: Medicaid Other

## 2019-10-28 VITALS — BP 103/73 | HR 96 | Temp 97.5°F | Wt 206.6 lb

## 2019-10-28 DIAGNOSIS — Z3480 Encounter for supervision of other normal pregnancy, unspecified trimester: Secondary | ICD-10-CM

## 2019-10-28 DIAGNOSIS — O09299 Supervision of pregnancy with other poor reproductive or obstetric history, unspecified trimester: Secondary | ICD-10-CM

## 2019-10-28 NOTE — Patient Instructions (Signed)
u Group B Streptococcus Test During Pregnancy Why am I having this test? Routine testing, also called screening, for group B streptococcus (GBS) is recommended for all pregnant women between the 36th and 37th week of pregnancy. GBS is a type of bacteria that can be passed from mother to baby during childbirth. Screening will help guide whether or not you will need treatment during labor and delivery to prevent complications such as:  An infection in your uterus during labor.  An infection in your uterus after delivery.  A serious infection in your baby after delivery, such as pneumonia, meningitis, or sepsis. GBS screening is not often done before 36 weeks of pregnancy unless you go into labor prematurely. What happens if I have group B streptococcus? If testing shows that you have GBS, your health care provider will recommend treatment with IV antibiotics during labor and delivery. This treatment significantly decreases the risk of complications for you and your baby. If you have a planned C-section and you have GBS, you may not need to be treated with antibiotics because GBS is usually passed to babies after labor starts and your water breaks. If you are in labor or your water breaks before your C-section, it is possible for GBS to get into your uterus and be passed to your baby, so you might need treatment. Is there a chance I may not need to be tested? You may not need to be tested for GBS if:  You have a urine test that shows GBS before 36 to 37 weeks.  You had a baby with GBS infection after a previous delivery. In these cases, you will automatically be treated for GBS during labor and delivery. What is being tested? This test is done to check if you have group B streptococcus in your vagina or rectum. What kind of sample is taken? To collect samples for this test, your health care provider will swab your vagina and rectum with a cotton swab. The sample is then sent to the lab to see if  GBS is present. What happens during the test?   You will remove your clothing from the waist down.  You will lie down on an exam table in the same position as you would for a pelvic exam.  Your health care provider will swab your vagina and rectum to collect samples for a culture test.  You will be able to go home after the test and do all your usual activities. How are the results reported? The test results are reported as positive or negative. What do the results mean?  A positive test means you are at risk for passing GBS to your baby during labor and delivery. Your health care provider will recommend that you are treated with an IV antibiotic during labor and delivery.  A negative test means you are at very low risk of passing GBS to your baby. There is still a low risk of passing GBS to your baby because sometimes test results may report that you do not have a condition when you do (false-negative result) or there is a chance that you may become infected with GBS after the test is done. You most likely will not need to be treated with an antibiotic during labor and delivery. Talk with your health care provider about what your results mean. Questions to ask your health care provider Ask your health care provider, or the department that is doing the test:  When will my results be ready?  How will  get my results?  What are my treatment options? Summary  Routine testing (screening) for group B streptococcus (GBS) is recommended for all pregnant women between the 36th and 37th week of pregnancy.  GBS is a type of bacteria that can be passed from mother to baby during childbirth.  If testing shows that you have GBS, your health care provider will recommend that you are treated with IV antibiotics during labor and delivery. This treatment almost always prevents infection in newborns. This information is not intended to replace advice given to you by your health care provider. Make  sure you discuss any questions you have with your health care provider. Document Revised: 07/08/2018 Document Reviewed: 04/14/2018 Elsevier Patient Education  2020 [employer]  

## 2019-10-28 NOTE — Progress Notes (Signed)
   LOW-RISK PREGNANCY OFFICE VISIT  Patient name: Nicole Lane MRN [id number]  Date of birth: [birth year] Chief Complaint:   Routine Prenatal Visit  Subjective:   Nicole Lane is a 24 y.o. G58P1001 female at [redacted]w[redacted]d with an Estimated Date of Delivery: 12/22/19 being seen today for ongoing management of a low-risk pregnancy aeb has Supervision of other normal pregnancy, antepartum; Nausea and vomiting of pregnancy, antepartum; and Heartburn during pregnancy in third trimester on their problem list.  Patient presents today with  no complaints. Patient denies vaginal concerns including abnormal discharge, leaking of fluid, and bleeding. Patient reports continued heartburn, but states it is improved with medication dosing.  Patient endorses fetal movement and occasional pelvic pressure. She denies abdominal cramping or contractions.  Contractions: Not present. Vag. Bleeding: None.  Movement: Present.  Reviewed past medical,surgical, social, obstetrical and family history as well as problem list, medications and allergies.  Objective   Vitals:   10/28/19 1132  BP: 103/73  Pulse: 96  Temp: (!) 97.5 F (36.4 C)  Weight: (!) 206 lb 9.6 oz (93.7 kg)  Body mass index is 33.35 kg/m.  Total Weight Gain:6 lb 9.6 oz (2.994 kg)         Physical Examination:   General appearance: Well appearing, and in no distress  Mental status: Alert, oriented to person, place, and time  Skin: Warm & dry  Cardiovascular: Normal heart rate noted  Respiratory: Normal respiratory effort, no distress  Abdomen: Soft, gravid, nontender, AGA with Fundal height of Fundal Height: 31 cm  Pelvic: Cervical exam deferred           Extremities: Edema: None  Fetal Status: Fetal Heart Rate (bpm): 134  Movement: Present   No results found for this or any previous visit (from the past 24 hour(s)).  Assessment & Plan:  Low-risk pregnancy of a 24 y.o., G2P1001 at [redacted]w[redacted]d with an Estimated Date of Delivery: 12/22/19    1. Supervision of other normal pregnancy, antepartum -Considering Paragard PP for birth control method.  Informational pamphlet given. -Desires epidural for pain management. -Reassured that pelvic pressure is anticipated pregnancy complaint as fetus descends into pelvic area.  -Labs as below. -Educated on GBS bacteria including what it is, why we test, and how and when we treat if needed.  2. Hx of preeclampsia, prior pregnancy, currently pregnant -Taking baby aspirin.      Meds: No orders of the defined types were placed in this encounter.  Labs/procedures today:  Lab Orders  No laboratory test(s) ordered today     Reviewed: Preterm labor symptoms and general obstetric precautions including but not limited to vaginal bleeding, contractions, leaking of fluid and fetal movement were reviewed in detail with the patient.  All questions were answered.  Follow-up: Return for LROB with GBS.  No orders of the defined types were placed in this encounter.  Cherre Robins MSN, CNM 10/28/2019

## 2019-11-25 ENCOUNTER — Other Ambulatory Visit (HOSPITAL_COMMUNITY)
Admission: RE | Admit: 2019-11-25 | Discharge: 2019-11-25 | Disposition: A | Discharge status: Home / Self Care | Payer: Medicaid Other | Source: Ambulatory Visit

## 2019-11-25 ENCOUNTER — Other Ambulatory Visit: Payer: Self-pay

## 2019-11-25 ENCOUNTER — Ambulatory Visit (INDEPENDENT_AMBULATORY_CARE_PROVIDER_SITE_OTHER): Payer: Medicaid Other

## 2019-11-25 VITALS — BP 104/72 | HR 114 | Temp 97.5°F | Wt 206.0 lb

## 2019-11-25 DIAGNOSIS — O09299 Supervision of pregnancy with other poor reproductive or obstetric history, unspecified trimester: Secondary | ICD-10-CM

## 2019-11-25 DIAGNOSIS — Z3480 Encounter for supervision of other normal pregnancy, unspecified trimester: Secondary | ICD-10-CM | POA: Insufficient documentation

## 2019-11-25 DIAGNOSIS — O26893 Other specified pregnancy related conditions, third trimester: Secondary | ICD-10-CM

## 2019-11-25 DIAGNOSIS — R519 Headache, unspecified: Secondary | ICD-10-CM

## 2019-11-25 DIAGNOSIS — Z3A36 36 weeks gestation of pregnancy: Secondary | ICD-10-CM

## 2019-11-25 MED ORDER — BUTALBITAL-APAP-CAFFEINE 50-325-40 MG PO CAPS: 1.0000 | ORAL_CAPSULE | Freq: Four times a day (QID) | ORAL | 1 refills | Status: AC | PRN

## 2019-11-25 NOTE — Patient Instructions (Signed)

## 2019-11-25 NOTE — Progress Notes (Signed)
LOW-RISK PREGNANCY OFFICE VISIT  Patient name: Nicole Lane MRN [id number]  Date of birth: 05-14-1995 Chief Complaint:   Routine Prenatal Visit  Subjective:   Nicole Lane is a 24 y.o. G28P1001 female at [redacted]w[redacted]d with an Estimated Date of Delivery: 12/22/19 being seen today for ongoing management of a low-risk pregnancy aeb has Supervision of other normal pregnancy, antepartum; Nausea and vomiting of pregnancy, antepartum; and Heartburn during pregnancy in third trimester on their problem list.  Patient presents today with complaint of headache. Patient states she has been having HA every other day and is sometimes not relieved with tylenol.  Patient reports she drinks about 5 bottles of water daily.  Patient also reports some edema of her feet and although she works at a desk, she is not able to elevate them.  Patient endorses fetal movement and denies cramping or contractions. Patient denies vaginal concerns including abnormal discharge, leaking of fluid, and bleeding.  Contractions: Not present. Vag. Bleeding: None.  Movement: Present.  Reviewed past medical,surgical, social, obstetrical and family history as well as problem list, medications and allergies.  Objective   Vitals:   11/25/19 1104  BP: 104/72  Pulse: (!) 114  Temp: (!) 97.5 F (36.4 C)  Weight: 206 lb (93.4 kg)  Body mass index is 33.25 kg/m.  Total Weight Gain:6 lb (2.722 kg)         Physical Examination:   General appearance: Well appearing, and in no distress  Mental status: Alert, oriented to person, place, and time  Skin: Warm & dry  Cardiovascular: Normal heart rate noted  Respiratory: Normal respiratory effort, no distress  Abdomen: Soft, gravid, nontender, AGA with Fundal height of Fundal Height: 36 cm  Pelvic: Cervical exam performed  Dilation: 1.5 Effacement (%): 50 Station: Ballotable Presentation: Vertex  Extremities: Edema: Trace  Fetal Status: Fetal Heart Rate (bpm): 140  Movement:  Present   No results found for this or any previous visit (from the past 24 hour(s)).  Assessment & Plan:  Low-risk pregnancy of a 24 y.o., G2P1001 at [redacted]w[redacted]d with an Estimated Date of Delivery: 12/22/19   1. Supervision of other normal pregnancy, antepartum -Discussed out of work prior to delivery.  Patient informed that provider does not advise currently. -Anticipatory guidance for upcoming appt.  Patient opts for in-person. -Encouraged to elevate feet when possible.  2. Hx of preeclampsia, prior pregnancy, currently pregnant -Take baby aspirin daily  3. [redacted] weeks gestation of pregnancy -Labs as below. -Educated on GBS bacteria including what it is, why we test, and how and when we treat if needed. -Discussed releasing of results to mychart. -Discussed and reviewed postpartum planning including contraception, pediatricians, and infant feeding. *Patient desires to breastfeed and is considering Paragard for contraception.   4. Headache in pregnancy, antepartum, third trimester -Discussed increasing water intake to at least weight in kg daily which is roughly 93 ounces. -Discussed usage of Fioricet for migraines not relieved with tylenol.  Cautioned regarding tylenol in Fioricet and need to monitor overall intake. -Rx for Fioricet sent to pharmacy on file.    Meds:  Meds ordered this encounter  Medications  . Butalbital-APAP-Caffeine 50-325-40 MG capsule    Sig: Take 1-2 capsules by mouth every 6 (six) hours as needed for headache.    Dispense:  30 capsule    Refill:  1    Order Specific Question:   Supervising Provider    Answer:   Samara Snide   Labs/procedures today:  Lab Orders  Culture, beta strep (group b only)   Reviewed: Term labor symptoms and general obstetric precautions including but not limited to vaginal bleeding, contractions, leaking of fluid and fetal movement were reviewed in detail with the patient.  All questions were answered.  Follow-up: Return  in about 1 week (around 12/02/2019) for LROB.  Orders Placed This Encounter  Procedures  . Culture, beta strep (group b only)   Cherre Robins MSN, CNM 11/25/2019

## 2019-11-28 ENCOUNTER — Inpatient Hospital Stay (HOSPITAL_COMMUNITY): Payer: Medicaid Other

## 2019-11-28 ENCOUNTER — Inpatient Hospital Stay (HOSPITAL_COMMUNITY)
Admission: AD | Admit: 2019-11-28 | Discharge: 2019-11-28 | Disposition: A | Discharge status: Home / Self Care | Payer: Medicaid Other | Attending: Obstetrics & Gynecology | Admitting: Obstetrics & Gynecology

## 2019-11-28 ENCOUNTER — Other Ambulatory Visit: Payer: Self-pay

## 2019-11-28 ENCOUNTER — Encounter (HOSPITAL_COMMUNITY): Payer: Self-pay | Admitting: Obstetrics & Gynecology

## 2019-11-28 DIAGNOSIS — O99613 Diseases of the digestive system complicating pregnancy, third trimester: Secondary | ICD-10-CM

## 2019-11-28 DIAGNOSIS — O26613 Liver and biliary tract disorders in pregnancy, third trimester: Secondary | ICD-10-CM | POA: Insufficient documentation

## 2019-11-28 DIAGNOSIS — Z79899 Other long term (current) drug therapy: Secondary | ICD-10-CM | POA: Insufficient documentation

## 2019-11-28 DIAGNOSIS — O99013 Anemia complicating pregnancy, third trimester: Secondary | ICD-10-CM | POA: Diagnosis present

## 2019-11-28 DIAGNOSIS — N1330 Unspecified hydronephrosis: Secondary | ICD-10-CM | POA: Diagnosis not present

## 2019-11-28 DIAGNOSIS — O26893 Other specified pregnancy related conditions, third trimester: Secondary | ICD-10-CM | POA: Diagnosis present

## 2019-11-28 DIAGNOSIS — D649 Anemia, unspecified: Secondary | ICD-10-CM | POA: Diagnosis not present

## 2019-11-28 DIAGNOSIS — R7401 Elevation of levels of liver transaminase levels: Secondary | ICD-10-CM | POA: Diagnosis not present

## 2019-11-28 DIAGNOSIS — R300 Dysuria: Secondary | ICD-10-CM | POA: Diagnosis not present

## 2019-11-28 DIAGNOSIS — R1030 Lower abdominal pain, unspecified: Secondary | ICD-10-CM

## 2019-11-28 DIAGNOSIS — K828 Other specified diseases of gallbladder: Secondary | ICD-10-CM

## 2019-11-28 DIAGNOSIS — Z3A36 36 weeks gestation of pregnancy: Secondary | ICD-10-CM | POA: Diagnosis not present

## 2019-11-28 DIAGNOSIS — R1011 Right upper quadrant pain: Secondary | ICD-10-CM

## 2019-11-28 DIAGNOSIS — Z7982 Long term (current) use of aspirin: Secondary | ICD-10-CM | POA: Diagnosis not present

## 2019-11-28 LAB — COMPREHENSIVE METABOLIC PANEL
ALT: 45 U/L — ABNORMAL HIGH (ref 0–44)
AST: 32 U/L (ref 15–41)
Albumin: 2.6 g/dL — ABNORMAL LOW (ref 3.5–5.0)
Alkaline Phosphatase: 192 U/L — ABNORMAL HIGH (ref 38–126)
Anion gap: 12 (ref 5–15)
BUN: 7 mg/dL (ref 6–20)
CO2: 23 mmol/L (ref 22–32)
Calcium: 8.8 mg/dL — ABNORMAL LOW (ref 8.9–10.3)
Chloride: 102 mmol/L (ref 98–111)
Creatinine, Ser: 0.62 mg/dL (ref 0.44–1.00)
GFR calc Af Amer: >60 mL/min (ref >60)
GFR calc non Af Amer: >60 mL/min (ref >60)
Glucose, Bld: 87 mg/dL (ref 70–99)
Potassium: 3.5 mmol/L (ref 3.5–5.1)
Sodium: 137 mmol/L (ref 135–145)
Total Bilirubin: 0.5 mg/dL (ref 0.3–1.2)
Total Protein: 6.6 g/dL (ref 6.5–8.1)

## 2019-11-28 LAB — CERVICOVAGINAL ANCILLARY ONLY
Chlamydia: NEGATIVE
Comment: NEGATIVE
Comment: NEGATIVE
Comment: NORMAL
Neisseria Gonorrhea: NEGATIVE
Trichomonas: NEGATIVE

## 2019-11-28 LAB — CBC
HCT: 32.9 % — ABNORMAL LOW (ref 36.0–46.0)
Hemoglobin: 10 g/dL — ABNORMAL LOW (ref 12.0–15.0)
MCH: 23.9 pg — ABNORMAL LOW (ref 26.0–34.0)
MCHC: 30.4 g/dL (ref 30.0–36.0)
MCV: 78.7 fL — ABNORMAL LOW (ref 80.0–100.0)
Platelets: 342 10*3/uL (ref 150–400)
RBC: 4.18 MIL/uL (ref 3.87–5.11)
RDW: 15.4 % (ref 11.5–15.5)
WBC: 10.4 10*3/uL (ref 4.0–10.5)
nRBC: 0 % (ref 0.0–0.2)

## 2019-11-28 LAB — URINALYSIS, ROUTINE W REFLEX MICROSCOPIC
Bilirubin Urine: NEGATIVE
Glucose, UA: NEGATIVE mg/dL
Hgb urine dipstick: NEGATIVE
Ketones, ur: 20 mg/dL — AB
Nitrite: NEGATIVE
Protein, ur: NEGATIVE mg/dL
Specific Gravity, Urine: 1.006 (ref 1.005–1.030)
pH: 7 (ref 5.0–8.0)

## 2019-11-28 LAB — PROTEIN / CREATININE RATIO, URINE
Creatinine, Urine: 52.26 mg/dL
Protein Creatinine Ratio: 0.15 mg/mg{Cre} (ref 0.00–0.15)
Total Protein, Urine: 8 mg/dL

## 2019-11-28 MED ORDER — FERROUS SULFATE 325 (65 FE) MG PO TABS: 325.0000 mg | ORAL_TABLET | ORAL | 3 refills | Status: AC

## 2019-11-28 NOTE — Discharge Instructions (Signed)
Cholelithiasis  Cholelithiasis is a form of gallbladder disease in which gallstones form in the gallbladder. The gallbladder is an organ that stores bile. Bile is made in the liver, and it helps to digest fats. Gallstones begin as small crystals and slowly grow into stones. They may cause no symptoms until the gallbladder tightens (contracts) and a gallstone is blocking the duct (gallbladder attack), which can cause pain. Cholelithiasis is also referred to as gallstones. There are two main types of gallstones:  Cholesterol stones. These are made of hardened cholesterol and are usually yellow-green in color. They are the most common type of gallstone. Cholesterol is a white, waxy, fat-like substance that is made in the liver.  Pigment stones. These are dark in color and are made of a red-yellow substance that forms when hemoglobin from red blood cells breaks down (bilirubin). What are the causes? This condition may be caused by an imbalance in the substances that bile is made of. This can happen if the bile:  Has too much bilirubin.  Has too much cholesterol.  Does not have enough bile salts. These salts help the body absorb and digest fats. In some cases, this condition can also be caused by the gallbladder not emptying completely or often enough. What increases the risk? The following factors may make you more likely to develop this condition:  Being female.  Having multiple pregnancies. Health care providers sometimes advise removing diseased gallbladders before future pregnancies.  Eating a diet that is heavy in fried foods, fat, and refined carbohydrates, like white bread and white rice.  Being obese.  Being older than age 25.  Prolonged use of medicines that contain female hormones (estrogen).  Having diabetes mellitus.  Rapidly losing weight.  Having a family history of gallstones.  Being of American Indian or [place] descent.  Having an intestinal disease such as Crohn  disease.  Having metabolic syndrome.  Having cirrhosis.  Having severe types of anemia such as sickle cell anemia. What are the signs or symptoms? In most cases, there are no symptoms. These are known as silent gallstones. If a gallstone blocks the bile ducts, it can cause a gallbladder attack. The main symptom of a gallbladder attack is sudden pain in the upper right abdomen. The pain usually comes at night or after eating a large meal. The pain can last for one or several hours and can spread to the right shoulder or chest. If the bile duct is blocked for more than a few hours, it can cause infection or inflammation of the gallbladder, liver, or pancreas, which may cause:  Nausea.  Vomiting.  Abdominal pain that lasts for 5 hours or more.  Fever or chills.  Yellowing of the skin or the whites of the eyes (jaundice).  Dark urine.  Light-colored stools. How is this diagnosed? This condition may be diagnosed based on:  A physical exam.  Your medical history.  An ultrasound of your gallbladder.  CT scan.  MRI.  Blood tests to check for signs of infection or inflammation.  A scan of your gallbladder and bile ducts (biliary system) using nonharmful radioactive material and special cameras that can see the radioactive material (cholescintigram). This test checks to see how your gallbladder contracts and whether bile ducts are blocked.  Inserting a small tube with a camera on the end (endoscope) through your mouth to inspect bile ducts and check for blockages (endoscopic retrograde cholangiopancreatogram). How is this treated? Treatment for gallstones depends on the severity of the condition.   Silent gallstones do not need treatment. If the gallstones cause a gallbladder attack or other symptoms, treatment may be required. Options for treatment include:  Surgery to remove the gallbladder (cholecystectomy). This is the most common treatment.  Medicines to dissolve gallstones.  These are most effective at treating small gallstones. You may need to take medicines for up to 6-12 months.  Shock wave treatment (extracorporeal biliary lithotripsy). In this treatment, an ultrasound machine sends shock waves to the gallbladder to break gallstones into smaller pieces. These pieces can then be passed into the intestines or be dissolved by medicine. This is rarely used.  Removing gallstones through endoscopic retrograde cholangiopancreatogram. A small basket can be attached to the endoscope and used to capture and remove gallstones. Follow these instructions at home:  Take over-the-counter and prescription medicines only as told by your health care provider.  Maintain a healthy weight and follow a healthy diet. This includes: ? Reducing fatty foods, such as fried food. ? Reducing refined carbohydrates, like white bread and white rice. ? Increasing fiber. Aim for foods like almonds, fruit, and beans.  Keep all follow-up visits as told by your health care provider. This is important. Contact a health care provider if:  You think you have had a gallbladder attack.  You have been diagnosed with silent gallstones and you develop abdominal pain or indigestion. Get help right away if:  You have pain from a gallbladder attack that lasts for more than 2 hours.  You have abdominal pain that lasts for more than 5 hours.  You have a fever or chills.  You have persistent nausea and vomiting.  You develop jaundice.  You have dark urine or light-colored stools. Summary  Cholelithiasis (also called gallstones) is a form of gallbladder disease in which gallstones form in the gallbladder.  This condition is caused by an imbalance in the substances that make up bile. This can happen if the bile has too much cholesterol, too much bilirubin, or not enough bile salts.  You are more likely to develop this condition if you are female, pregnant, using medicines with estrogen, obese,  older than age 25, or have a family history of gallstones. You may also develop gallstones if you have diabetes, an intestinal disease, cirrhosis, or metabolic syndrome.  Treatment for gallstones depends on the severity of the condition. Silent gallstones do not need treatment.  If gallstones cause a gallbladder attack or other symptoms, treatment may be needed. The most common treatment is surgery to remove the gallbladder. This information is not intended to replace advice given to you by your health care provider. Make sure you discuss any questions you have with your health care provider. Document Revised: 02/27/2017 Document Reviewed: 12/02/2015 Elsevier Patient Education  2020 [employer]        Cholecystitis  Cholecystitis is inflammation of the gallbladder. It is often called a gallbladder attack. The gallbladder is a pear-shaped organ that lies beneath the liver on the right side of the body. The gallbladder stores bile, which is a fluid that helps the body digest fats. If bile builds up in your gallbladder, your gallbladder becomes inflamed. This condition may occur suddenly. Cholecystitis is a serious condition and requires treatment. What are the causes? The most common cause of this condition is gallstones. Gallstones can block the tube (duct) that carries bile out of your gallbladder. This causes bile to build up. Other causes include:  Damage to the gallbladder due to a decrease in blood flow.  Infections  in the bile ducts.  Scars or kinks in the bile ducts.  Tumors in the liver, pancreas, or gallbladder. What increases the risk? You are more likely to develop this condition if:  You have sickle cell disease.  You take birth control pills or use estrogen.  You have alcoholic liver disease.  You have liver cirrhosis.  You have your nutrition delivered through a vein (parenteral nutrition).  You are critically ill.  You do not eat or drink for a long time.  This is also called "fasting."  You are obese.  You lose weight too fast.  You are pregnant.  You have high levels of fat (triglycerides) in the blood.  You have pancreatitis. What are the signs or symptoms? Symptoms of this condition include:  Pain in the abdomen, especially in the upper right area of the abdomen.  Tenderness or bloating in the abdomen.  Nausea.  Vomiting.  Fever.  Chills. How is this diagnosed? This condition is diagnosed with a medical history and physical exam. You may also have other tests, including:  Imaging tests, such as: ? An ultrasound of the gallbladder. ? A CT scan of the abdomen. ? A gallbladder nuclear scan (HIDA scan). This scan allows your health care provider to see the bile moving from your liver to your gallbladder and on to your small intestine. ? MRI.  Blood tests, such as: ? A complete blood count. The white blood cell count may be higher than normal. ? Liver function tests. Certain types of gallstones cause some results to be higher than normal. How is this treated? Treatment may include:  Surgery to remove your gallbladder (cholecystectomy).  Antibiotic medicine, usually through an IV.  Fasting for a certain amount of time.  Giving IV fluids.  Medicine to treat pain or vomiting. Follow these instructions at home:  If you had surgery, follow instructions from your health care provider about home care after the procedure. Medicines   Take over-the-counter and prescription medicines only as told by your health care provider.  If you were prescribed an antibiotic medicine, take it as told by your health care provider. Do not stop taking the antibiotic even if you start to feel better. General instructions  Follow instructions from your health care provider about what to eat or drink. When you are allowed to eat, avoid eating or drinking anything that triggers your symptoms.  Do not lift anything that is heavier than  10 lb (4.5 kg), or the limit that you are told, until your health care provider says that it is safe.  Do not use any products that contain nicotine or tobacco, such as cigarettes and e-cigarettes. If you need help quitting, ask your health care provider.  Keep all follow-up visits as told by your health care provider. This is important. Contact a health care provider if:  Your pain is not controlled with medicine.  You have a fever. Get help right away if:  Your pain moves to another part of your abdomen or to your back.  You continue to have symptoms or you develop new symptoms even with treatment. Summary  Cholecystitis is inflammation of the gallbladder.  The most common cause of this condition is gallstones. Gallstones can block the tube (duct) that carries bile out of your gallbladder.  Common symptoms are pain in the abdomen, nausea, vomiting, fever, and chills.  This condition is treated with surgery to remove the gallbladder, medicines, fasting, and IV fluids.  Follow your health care provider's  instructions for eating and drinking. Avoid eating anything that triggers your symptoms. This information is not intended to replace advice given to you by your health care provider. Make sure you discuss any questions you have with your health care provider. Document Revised: 07/24/2017 Document Reviewed: 07/24/2017 Elsevier Patient Education  2020 [employer]        Gallbladder Eating Plan If you have a gallbladder condition, you may have trouble digesting fats. Eating a low-fat diet can help reduce your symptoms, and may be helpful before and after having surgery to remove your gallbladder (cholecystectomy). Your health care provider may recommend that you work with a diet and nutrition specialist (dietitian) to help you reduce the amount of fat in your diet. What are tips for following this plan? General guidelines  Limit your fat intake to less than 30% of your total  daily calories. If you eat around 1,800 calories each day, this is less than 60 grams (g) of fat per day.  Fat is an important part of a healthy diet. Eating a low-fat diet can make it hard to maintain a healthy body weight. Ask your dietitian how much fat, calories, and other nutrients you need each day.  Eat small, frequent meals throughout the day instead of three large meals.  Drink at least 8-10 cups of fluid a day. Drink enough fluid to keep your urine clear or pale yellow.  Limit alcohol intake to no more than 1 drink a day for nonpregnant women and 2 drinks a day for men. One drink equals 12 oz of beer, 5 oz of wine, or 1 oz of hard liquor. Reading food labels  Check Nutrition Facts on food labels for the amount of fat per serving. Choose foods with less than 3 grams of fat per serving. Shopping  Choose nonfat and low-fat healthy foods. Look for the words "nonfat," "low fat," or "fat free."  Avoid buying processed or prepackaged foods. Cooking  Cook using low-fat methods, such as baking, broiling, grilling, or boiling.  Cook with small amounts of healthy fats, such as olive oil, grapeseed oil, canola oil, or sunflower oil. What foods are recommended?   All fresh, frozen, or canned fruits and vegetables.  Whole grains.  Low-fat or non-fat (skim) milk and yogurt.  Lean meat, skinless poultry, fish, eggs, and beans.  Low-fat protein supplement powders or drinks.  Spices and herbs. What foods are not recommended?  High-fat foods. These include baked goods, fast food, fatty cuts of meat, ice cream, french toast, sweet rolls, pizza, cheese bread, foods covered with butter, creamy sauces, or cheese.  Fried foods. These include french fries, tempura, battered fish, breaded chicken, fried breads, and sweets.  Foods with strong odors.  Foods that cause bloating and gas. Summary  A low-fat diet can be helpful if you have a gallbladder condition, or before and after  gallbladder surgery.  Limit your fat intake to less than 30% of your total daily calories. This is about 60 g of fat if you eat 1,800 calories each day.  Eat small, frequent meals throughout the day instead of three large meals. This information is not intended to replace advice given to you by your health care provider. Make sure you discuss any questions you have with your health care provider. Document Revised: 07/08/2018 Document Reviewed: 04/24/2016 Elsevier Patient Education  2020 [employer].        Signs and Symptoms of Labor Labor is your body's natural process of moving your baby, placenta,  and umbilical cord out of your uterus. The process of labor usually starts when your baby is full-term, between 49 and 40 weeks of pregnancy. How will I know when I am close to going into labor? As your body prepares for labor and the birth of your baby, you may notice the following symptoms in the weeks and days before true labor starts:  Having a strong desire to get your home ready to receive your new baby. This is called nesting. Nesting may be a sign that labor is approaching, and it may occur several weeks before birth. Nesting may involve cleaning and organizing your home.  Passing a small amount of thick, bloody mucus out of your vagina (normal bloody show or losing your mucus plug). This may happen more than a week before labor begins, or it might occur right before labor begins as the opening of the cervix starts to widen (dilate). For some women, the entire mucus plug passes at once. For others, smaller portions of the mucus plug may gradually pass over several days.  Your baby moving (dropping) lower in your pelvis to get into position for birth (lightening). When this happens, you may feel more pressure on your bladder and pelvic bone and less pressure on your ribs. This may make it easier to breathe. It may also cause you to need to urinate more often and have problems with bowel  movements.  Having "practice contractions" (Braxton Hicks contractions) that occur at irregular (unevenly spaced) intervals that are more than 10 minutes apart. This is also called false labor. False labor contractions are common after exercise or sexual activity, and they will stop if you change position, rest, or drink fluids. These contractions are usually mild and do not get stronger over time. They may feel like: ? A backache or back pain. ? Mild cramps, similar to menstrual cramps. ? Tightening or pressure in your abdomen. Other early symptoms that labor may be starting soon include:  Nausea or loss of appetite.  Diarrhea.  Having a sudden burst of energy, or feeling very tired.  Mood changes.  Having trouble sleeping. How will I know when labor has begun? Signs that true labor has begun may include:  Having contractions that come at regular (evenly spaced) intervals and increase in intensity. This may feel like more intense tightening or pressure in your abdomen that moves to your back. ? Contractions may also feel like rhythmic pain in your upper thighs or back that comes and goes at regular intervals. ? For first-time mothers, this change in intensity of contractions often occurs at a more gradual pace. ? Women who have given birth before may notice a more rapid progression of contraction changes.  Having a feeling of pressure in the vaginal area.  Your water breaking (rupture of membranes). This is when the sac of fluid that surrounds your baby breaks. When this happens, you will notice fluid leaking from your vagina. This may be clear or blood-tinged. Labor usually starts within 24 hours of your water breaking, but it may take longer to begin. ? Some women notice this as a gush of fluid. ? Others notice that their underwear repeatedly becomes damp. Follow these instructions at home:   When labor starts, or if your water breaks, call your health care provider or nurse care  line. Based on your situation, they will determine when you should go in for an exam.  When you are in early labor, you may be able to rest and  manage symptoms at home. Some strategies to try at home include: ? Breathing and relaxation techniques. ? Taking a warm bath or shower. ? Listening to music. ? Using a heating pad on the lower back for pain. If you are directed to use heat:  Place a towel between your skin and the heat source.  Leave the heat on for 20-30 minutes.  Remove the heat if your skin turns bright red. This is especially important if you are unable to feel pain, heat, or cold. You may have a greater risk of getting burned. Get help right away if:  You have painful, regular contractions that are 5 minutes apart or less.  Labor starts before you are [redacted] weeks along in your pregnancy.  You have a fever.  You have a headache that does not go away.  You have bright red blood coming from your vagina.  You do not feel your baby moving.  You have a sudden onset of: ? Severe headache with vision problems. ? Nausea, vomiting, or diarrhea. ? Chest pain or shortness of breath. These symptoms may be an emergency. If your health care provider recommends that you go to the hospital or birth center where you plan to deliver, do not drive yourself. Have someone else drive you, or call emergency services (911 in the [place] Summary  Labor is your body's natural process of moving your baby, placenta, and umbilical cord out of your uterus.  The process of labor usually starts when your baby is full-term, between 46 and 40 weeks of pregnancy.  When labor starts, or if your water breaks, call your health care provider or nurse care line. Based on your situation, they will determine when you should go in for an exam. This information is not intended to replace advice given to you by your health care provider. Make sure you discuss any questions you have with your health care  provider. Document Revised: 12/15/2016 Document Reviewed: 08/22/2016 Elsevier Patient Education  2020 [employer].

## 2019-11-28 NOTE — MAU Provider Note (Signed)
History     CSN: [id number]  Arrival date and time: 11/28/19 0159   First Provider Initiated Contact with Patient 11/28/19 0422      Chief Complaint  Patient presents with  . Abdominal Pain   Ms. Nicole Lane is a 24 y.o. G2P1001 at [redacted]w[redacted]d who presents to MAU for sharp lower abdominal pain, dysuria and RUQ pain.  Onset: 2 days for sharp lower abdominal pain, last night for RUQ pain/dysuria Location: lower abdomen, RUQ Duration: 1-2 days Character: sharp, intermittent, burning when urinating, no frequency of urination Aggravating/Associated: none/none Relieving: none Treatment: none Severity: 5/10 - lower abdomen only, RUQ pain not present at this time  Pt denies VB, LOF, ctx, decreased FM, vaginal discharge/odor/itching. Pt denies N/V, constipation, diarrhea. Pt denies fever, chills, fatigue, sweating or changes in appetite. Pt denies SOB or chest pain. Pt denies dizziness, HA, light-headedness, weakness.  Problems this pregnancy include: hx PEC. Allergies? NKDA Current medications/supplements? Low dose ASA, omeprazole, phenergan, flexeril PRN Prenatal care provider? [facility], next appt 12/07/2019   OB History    Gravida  2   Para  1   Term  1   Preterm  0   AB      Living  1     SAB      TAB      Ectopic      Multiple      Live Births  1           Past Medical History:  Diagnosis Date  . Blood transfusion without reported diagnosis    PPH  . Infection    UTI  . Pregnancy induced hypertension     Past Surgical History:  Procedure Laterality Date  . NO PAST SURGERIES      Family History  Problem Relation Age of Onset  . Cancer Father   . Hypertension Father     Social History   Tobacco Use  . Smoking status: Never Smoker  . Smokeless tobacco: Never Used  Vaping Use  . Vaping Use: Never used  Substance Use Topics  . Alcohol use: Never  . Drug use: Never    Allergies: No Known Allergies  Medications Prior to  Admission  Medication Sig Dispense Refill Last Dose  . aspirin EC 81 MG tablet Take 1 tablet (81 mg total) by mouth daily. 30 tablet 4   . Butalbital-APAP-Caffeine 50-325-40 MG capsule Take 1-2 capsules by mouth every 6 (six) hours as needed for headache. 30 capsule 1   . cyclobenzaprine (FLEXERIL) 10 MG tablet Take 1 tablet (10 mg total) by mouth 3 (three) times daily as needed for muscle spasms. 30 tablet 2   . Misc. Devices MISC Dispense one maternity belt for patient 1 each 0   . omeprazole (PRILOSEC) 10 MG capsule Take 1 capsule (10 mg total) by mouth daily. 30 capsule 3   . Prenatal Vit-Fe Fumarate-FA (MULTIVITAMIN-PRENATAL) 27-0.8 MG TABS tablet Take 1 tablet by mouth daily at 12 noon.     . promethazine (PHENERGAN) 25 MG tablet Take 1 tablet (25 mg total) by mouth every 6 (six) hours as needed for nausea or vomiting. 30 tablet 1     Review of Systems  Constitutional: Negative for chills, diaphoresis, fatigue and fever.  Eyes: Negative for visual disturbance.  Respiratory: Negative for shortness of breath.   Cardiovascular: Negative for chest pain.  Gastrointestinal: Positive for abdominal pain. Negative for constipation, diarrhea, nausea and vomiting.  Genitourinary: Positive for dysuria. Negative for flank  pain, frequency, pelvic pain, urgency, vaginal bleeding and vaginal discharge.  Neurological: Negative for dizziness, weakness, light-headedness and headaches.   Physical Exam   Blood pressure 108/73, pulse 87, temperature 97.8 F (36.6 C), temperature source Oral, resp. rate 17, height  (1.676 m), weight 93.1 kg, SpO2 97 %.  Patient Vitals for the past 24 hrs:  BP Temp Temp src Pulse Resp SpO2 Height Weight  11/28/19 0430 -- -- -- -- -- 97 % -- --  11/28/19 0424 -- -- -- -- -- 96 % -- --  11/28/19 0420 -- -- -- -- -- 91 % -- --  11/28/19 0410 -- -- -- -- -- 99 % -- --  11/28/19 0400 108/73 -- -- 87 -- 99 % -- --  11/28/19 0350 -- -- -- -- -- 99 % -- --  11/28/19 0340  -- -- -- -- -- 98 % -- --  11/28/19 0330 103/72 -- -- (!) 104 -- 98 % -- --  11/28/19 0320 -- -- -- -- -- 97 % -- --  11/28/19 0310 -- -- -- -- -- 98 % -- --  11/28/19 0300 106/76 -- -- (!) 102 -- 98 % -- --  11/28/19 0250 -- -- -- -- -- 98 % -- --  11/28/19 0240 -- -- -- -- -- 98 % -- --  11/28/19 0231 98/78 97.8 F (36.6 C) Oral (!) 101 17 100 %  (1.676 m) 93.1 kg  11/28/19 0230 -- -- -- -- -- 100 % -- --   Physical Exam Constitutional:      General: She is not in acute distress.    Appearance: She is well-developed. She is not diaphoretic.  HENT:     Head: Normocephalic and atraumatic.  Pulmonary:     Effort: Pulmonary effort is normal.  Abdominal:     General: There is no distension.     Palpations: Abdomen is soft. There is no mass.     Tenderness: There is no abdominal tenderness. There is no guarding or rebound.  Genitourinary:    Vagina: No vaginal discharge.  Skin:    General: Skin is warm and dry.  Neurological:     Mental Status: She is alert and oriented to person, place, and time.  Psychiatric:        Behavior: Behavior normal.        Thought Content: Thought content normal.        Judgment: Judgment normal.    Results for orders placed or performed during the hospital encounter of 11/28/19 (from the past 24 hour(s))  Urinalysis, Routine w reflex microscopic     Status: Abnormal   Collection Time: 11/28/19  2:46 AM  Result Value Ref Range   Color, Urine YELLOW YELLOW   APPearance HAZY (A) CLEAR   Specific Gravity, Urine 1.006 1.005 - 1.030   pH 7.0 5.0 - 8.0   Glucose, UA NEGATIVE NEGATIVE mg/dL   Hgb urine dipstick NEGATIVE NEGATIVE   Bilirubin Urine NEGATIVE NEGATIVE   Ketones, ur 20 (A) NEGATIVE mg/dL   Protein, ur NEGATIVE NEGATIVE mg/dL   Nitrite NEGATIVE NEGATIVE   Leukocytes,Ua LARGE (A) NEGATIVE   RBC / HPF 0-5 0 - 5 RBC/hpf   WBC, UA 11-20 0 - 5 WBC/hpf   Bacteria, UA RARE (A) NONE SEEN   Squamous Epithelial / LPF 6-10 0 - 5  Protein /  creatinine ratio, urine     Status: None   Collection Time: 11/28/19  2:46 AM  Result  Value Ref Range   Creatinine, Urine 52.26 mg/dL   Total Protein, Urine 8 mg/dL   Protein Creatinine Ratio 0.15 0.00 - 0.15 mg/mg[Cre]  CBC     Status: Abnormal   Collection Time: 11/28/19  3:27 AM  Result Value Ref Range   WBC 10.4 4.0 - 10.5 K/uL   RBC 4.18 3.87 - 5.11 MIL/uL   Hemoglobin 10.0 (L) 12.0 - 15.0 g/dL   HCT 16.132.9 (L) 36 - 46 %   MCV 78.7 (L) 80.0 - 100.0 fL   MCH 23.9 (L) 26.0 - 34.0 pg   MCHC 30.4 30.0 - 36.0 g/dL   RDW 09.615.4 04.511.5 - 40.915.5 %   Platelets 342 150 - 400 K/uL   nRBC 0.0 0.0 - 0.2 %  Comprehensive metabolic panel     Status: Abnormal   Collection Time: 11/28/19  3:27 AM  Result Value Ref Range   Sodium 137 135 - 145 mmol/L   Potassium 3.5 3.5 - 5.1 mmol/L   Chloride 102 98 - 111 mmol/L   CO2 23 22 - 32 mmol/L   Glucose, Bld 87 70 - 99 mg/dL   BUN 7 6 - 20 mg/dL   Creatinine, Ser 8.110.62 0.44 - 1.00 mg/dL   Calcium 8.8 (L) 8.9 - 10.3 mg/dL   Total Protein 6.6 6.5 - 8.1 g/dL   Albumin 2.6 (L) 3.5 - 5.0 g/dL   AST 32 15 - 41 U/L   ALT 45 (H) 0 - 44 U/L   Alkaline Phosphatase 192 (H) 38 - 126 U/L   Total Bilirubin 0.5 0.3 - 1.2 mg/dL   GFR calc non Af Amer >60 >60 mL/min   GFR calc Af Amer >60 >60 mL/min   Anion gap 12 5 - 15   US Abdomen Limited RUQ  Result Date: 11/28/2019 CLINICAL DATA:  Right upper quadrant pain EXAM: ULTRASOUND ABDOMEN LIMITED RIGHT UPPER QUADRANT COMPARISON:  Abdominal CT 02/10/2013 FINDINGS: Gallbladder: Distended gallbladder with sludge. No wall thickening or focal tenderness (as confirmed verbally). Common bile duct: Diameter: 3 mm Liver: No focal lesion identified. Within normal limits in parenchymal echogenicity. Portal vein is patent on color Doppler imaging with normal direction of blood flow towards the liver. Other: Prominent right hydronephrosis. IMPRESSION: 1. Prominent right hydronephrosis. 2. Gallbladder sludge with possible superimposed  calculi. No evidence of acute cholecystitis. Electronically Signed   By: Marnee SpringJonathon  Watts M.D.   On: 11/28/2019 05:15    MAU Course  Procedures  MDM -sharp lower abdominal pain 6/10 + dysuria, denies frequency, urgency -RUQ pain not present at this time -hx PEC -UA: hazy/20ketones/lg leuks/rare bacteria, sending urine for culture -CBC: H/H 10.0/32.9 -CMP: ALT 45 -PCr: 0.15 -US: 1. Prominent right hydronephrosis. 2. Gallbladder sludge with possible superimposed calculi. No evidence of acute cholecystitis. -EFM: reactive       -baseline: 120       -variability: moderate       -accels: present, 15x15       -decels: absent       -TOCO: few, irregular ctx -consulted with Dr. Despina HiddenEure re: elevated liver enzymes, OK to have them rechecked at next visit on 12/07/2019 -pt discharged to home in stable condition  Orders Placed This Encounter  Procedures  . Culture, OB Urine    Standing Status:   Standing    Number of Occurrences:   1  . US Abdomen Limited RUQ    Standing Status:   Standing    Number of Occurrences:   1  Order Specific Question:   Symptom/Reason for Exam    Answer:   RUQ pain [251471]  . Urinalysis, Routine w reflex microscopic    Standing Status:   Standing    Number of Occurrences:   1  . Protein / creatinine ratio, urine    Standing Status:   Standing    Number of Occurrences:   1  . CBC    Standing Status:   Standing    Number of Occurrences:   1  . Comprehensive metabolic panel    Standing Status:   Standing    Number of Occurrences:   1  . Discharge patient    Order Specific Question:   Discharge disposition    Answer:   01-Home or Self Care [1]    Order Specific Question:   Discharge patient date    Answer:   11/28/2019   Meds ordered this encounter  Medications  . ferrous sulfate 325 (65 FE) MG tablet    Sig: Take 1 tablet (325 mg total) by mouth every other day.    Dispense:  30 tablet    Refill:  3    Order Specific Question:   Supervising Provider     Answer:   Duane Lope H [[id number]]    Assessment and Plan   1. RUQ pain   2. Lower abdominal pain   3. Dysuria during pregnancy in third trimester   4. Elevated alanine aminotransferase (ALT) level   5. Anemia during pregnancy in third trimester   6. Gallbladder sludge     Allergies as of 11/28/2019   No Known Allergies     Medication List    TAKE these medications   aspirin EC 81 MG tablet Take 1 tablet (81 mg total) by mouth daily.   Butalbital-APAP-Caffeine 50-325-40 MG capsule Take 1-2 capsules by mouth every 6 (six) hours as needed for headache.   cyclobenzaprine 10 MG tablet Commonly known as: FLEXERIL Take 1 tablet (10 mg total) by mouth 3 (three) times daily as needed for muscle spasms.   ferrous sulfate 325 (65 FE) MG tablet Take 1 tablet (325 mg total) by mouth every other day.   Misc. Devices Misc Dispense one maternity belt for patient   multivitamin-prenatal 27-0.8 MG Tabs tablet Take 1 tablet by mouth daily at 12 noon.   omeprazole 10 MG capsule Commonly known as: PRILOSEC Take 1 capsule (10 mg total) by mouth daily.   promethazine 25 MG tablet Commonly known as: PHENERGAN Take 1 tablet (25 mg total) by mouth every 6 (six) hours as needed for nausea or vomiting.       -will call with culture results, if positive -RX iron -pt elects to start on ABX at time of culture results PRN, not at time of MAU visit -discussed s/sx of labor -recheck LFTs at next office visit, message sent to E. Lawrence, NP who has next scheduled appt with pt -Reviewed warning blood pressure values (systolic = / > 140 and/or diastolic =/> 90). Explained that, if blood pressure is elevated, she should sit down, rest, and eat/drink something. If still elevated 15 minutes later, and she is greater than 20 weeks, she should call clinic or come to MAU. She should come to MAU if she has elevated pressures and any of the following:  - headache not relieved with tylenol, rest,  hydration -blurry vision, floating spots in her vision - sudden full-body edema or facial edema -RUQ pain that is constant. These symptoms may indicate that  her blood pressure is worsening and she may be developing gestational hypertension or pre-eclampsia, which is an emergency.  -discussed s/sx of cholecystitis and when to return for evaluation -gallbladder diet discussed -return MAU precautions given -pt discharged to home in stable condition   Nicole Lane 11/28/2019, 5:49 AM

## 2019-11-28 NOTE — MAU Note (Signed)
.  Nicole Lane is a 24 y.o. at [redacted]w[redacted]d here in MAU reporting: sharp epigastric pain that began on 11/27/19 and got worse through tonight. Reports that her vision has gotten blurry and she see's white spots. Lower sharp/stabbing abdominal pain that is constant. Pt also reports N/V that began and has vomited 3 times in the past 24 hours.  Pt denies CTX, LOF, or vaginal bleeding. +FM

## 2019-11-29 ENCOUNTER — Encounter: Payer: Self-pay | Admitting: Student

## 2019-11-29 LAB — CULTURE, OB URINE: Culture: 70000 — AB

## 2019-11-30 ENCOUNTER — Other Ambulatory Visit (INDEPENDENT_AMBULATORY_CARE_PROVIDER_SITE_OTHER): Payer: Medicaid Other | Admitting: *Deleted

## 2019-11-30 ENCOUNTER — Other Ambulatory Visit: Payer: Self-pay

## 2019-11-30 ENCOUNTER — Other Ambulatory Visit: Payer: Self-pay | Admitting: Student

## 2019-11-30 DIAGNOSIS — L299 Pruritus, unspecified: Secondary | ICD-10-CM

## 2019-11-30 DIAGNOSIS — O2343 Unspecified infection of urinary tract in pregnancy, third trimester: Secondary | ICD-10-CM

## 2019-11-30 LAB — CULTURE, BETA STREP (GROUP B ONLY): Strep Gp B Culture: NEGATIVE

## 2019-11-30 MED ORDER — CLINDAMYCIN HCL 300 MG PO CAPS: 300.0000 mg | ORAL_CAPSULE | Freq: Three times a day (TID) | ORAL | 0 refills | Status: AC

## 2019-11-30 NOTE — Progress Notes (Signed)
.  clin

## 2019-11-30 NOTE — Progress Notes (Signed)
   Patient in clinic for lab work (bile acid and cmp) per Judeth Horn, NP. Patient is having itching on palms, arms and legs.   Clovis Pu, RN

## 2019-12-02 ENCOUNTER — Telehealth: Payer: Self-pay | Admitting: *Deleted

## 2019-12-02 ENCOUNTER — Inpatient Hospital Stay (HOSPITAL_COMMUNITY): Payer: Medicaid Other | Admitting: Anesthesiology

## 2019-12-02 ENCOUNTER — Inpatient Hospital Stay (HOSPITAL_COMMUNITY)
Admission: AD | Admit: 2019-12-02 | Discharge: 2019-12-05 | DRG: 805 | Disposition: A | Discharge status: Home / Self Care | Payer: Medicaid Other | Attending: Obstetrics and Gynecology | Admitting: Obstetrics and Gynecology

## 2019-12-02 ENCOUNTER — Other Ambulatory Visit: Payer: Self-pay

## 2019-12-02 ENCOUNTER — Encounter (HOSPITAL_COMMUNITY): Payer: Self-pay | Admitting: Obstetrics and Gynecology

## 2019-12-02 ENCOUNTER — Telehealth: Payer: Self-pay | Admitting: Student

## 2019-12-02 DIAGNOSIS — O2662 Liver and biliary tract disorders in childbirth: Principal | ICD-10-CM | POA: Diagnosis present

## 2019-12-02 DIAGNOSIS — K831 Obstruction of bile duct: Secondary | ICD-10-CM | POA: Diagnosis present

## 2019-12-02 DIAGNOSIS — Z20822 Contact with and (suspected) exposure to covid-19: Secondary | ICD-10-CM | POA: Diagnosis present

## 2019-12-02 DIAGNOSIS — O99214 Obesity complicating childbirth: Secondary | ICD-10-CM | POA: Diagnosis present

## 2019-12-02 DIAGNOSIS — E669 Obesity, unspecified: Secondary | ICD-10-CM | POA: Diagnosis present

## 2019-12-02 DIAGNOSIS — O9902 Anemia complicating childbirth: Secondary | ICD-10-CM | POA: Diagnosis present

## 2019-12-02 DIAGNOSIS — O9962 Diseases of the digestive system complicating childbirth: Secondary | ICD-10-CM | POA: Diagnosis present

## 2019-12-02 DIAGNOSIS — K219 Gastro-esophageal reflux disease without esophagitis: Secondary | ICD-10-CM | POA: Diagnosis present

## 2019-12-02 DIAGNOSIS — Z7982 Long term (current) use of aspirin: Secondary | ICD-10-CM | POA: Diagnosis not present

## 2019-12-02 DIAGNOSIS — Z3A37 37 weeks gestation of pregnancy: Secondary | ICD-10-CM | POA: Diagnosis not present

## 2019-12-02 DIAGNOSIS — O99013 Anemia complicating pregnancy, third trimester: Secondary | ICD-10-CM | POA: Diagnosis present

## 2019-12-02 HISTORY — DX: Headache, unspecified: R51.9

## 2019-12-02 HISTORY — DX: Gastro-esophageal reflux disease without esophagitis: K21.9

## 2019-12-02 LAB — BILE ACIDS, TOTAL: Bile Acids Total: 38.6 umol/L (ref 0.0–10.0)

## 2019-12-02 LAB — COMPREHENSIVE METABOLIC PANEL
ALT: 42 IU/L — ABNORMAL HIGH (ref 0–32)
AST: 26 IU/L (ref 0–40)
Albumin/Globulin Ratio: 1.3 (ref 1.2–2.2)
Albumin: 3.5 g/dL — ABNORMAL LOW (ref 3.9–5.0)
Alkaline Phosphatase: 236 IU/L — ABNORMAL HIGH (ref 48–121)
BUN/Creatinine Ratio: 11 (ref 9–23)
BUN: 6 mg/dL (ref 6–20)
Bilirubin Total: 0.4 mg/dL (ref 0.0–1.2)
CO2: 21 mmol/L (ref 20–29)
Calcium: 8.9 mg/dL (ref 8.7–10.2)
Chloride: 102 mmol/L (ref 96–106)
Creatinine, Ser: 0.55 mg/dL — ABNORMAL LOW (ref 0.57–1.00)
GFR calc Af Amer: 152 mL/min/{1.73_m2} (ref >59)
GFR calc non Af Amer: 132 mL/min/{1.73_m2} (ref >59)
Globulin, Total: 2.8 g/dL (ref 1.5–4.5)
Glucose: 106 mg/dL — ABNORMAL HIGH (ref 65–99)
Potassium: 4 mmol/L (ref 3.5–5.2)
Sodium: 137 mmol/L (ref 134–144)
Total Protein: 6.3 g/dL (ref 6.0–8.5)

## 2019-12-02 LAB — CBC
HCT: 35.4 % — ABNORMAL LOW (ref 36.0–46.0)
Hemoglobin: 10.7 g/dL — ABNORMAL LOW (ref 12.0–15.0)
MCH: 24.2 pg — ABNORMAL LOW (ref 26.0–34.0)
MCHC: 30.2 g/dL (ref 30.0–36.0)
MCV: 79.9 fL — ABNORMAL LOW (ref 80.0–100.0)
Platelets: 398 10*3/uL (ref 150–400)
RBC: 4.43 MIL/uL (ref 3.87–5.11)
RDW: 15.7 % — ABNORMAL HIGH (ref 11.5–15.5)
WBC: 9.5 10*3/uL (ref 4.0–10.5)
nRBC: 0.2 % (ref 0.0–0.2)

## 2019-12-02 LAB — SARS CORONAVIRUS 2 BY RT PCR (HOSPITAL ORDER, PERFORMED IN ~~LOC~~ HOSPITAL LAB): SARS Coronavirus 2: NEGATIVE

## 2019-12-02 LAB — TYPE AND SCREEN
ABO/RH(D): O POS
Antibody Screen: NEGATIVE

## 2019-12-02 MED ORDER — OXYCODONE-ACETAMINOPHEN 5-325 MG PO TABS: 1.0000 | ORAL_TABLET | ORAL | Status: DC | PRN

## 2019-12-02 MED ORDER — LIDOCAINE HCL (PF) 1 % IJ SOLN
30.0000 mL | INTRAMUSCULAR | Status: DC | PRN
  Filled 2019-12-02: qty 30

## 2019-12-02 MED ORDER — LACTATED RINGERS IV SOLN
500.0000 mL | Freq: Once | INTRAVENOUS | Status: AC
  Administered 2019-12-02: 500 mL via INTRAVENOUS

## 2019-12-02 MED ORDER — FENTANYL-BUPIVACAINE-NACL 0.5-0.125-0.9 MG/250ML-% EP SOLN
EPIDURAL | Status: AC
  Filled 2019-12-02: qty 250

## 2019-12-02 MED ORDER — FENTANYL CITRATE (PF) 100 MCG/2ML IJ SOLN
100.0000 ug | INTRAMUSCULAR | Status: DC | PRN
  Administered 2019-12-02 (×2): 100 ug via INTRAVENOUS
  Filled 2019-12-02 (×2): qty 2

## 2019-12-02 MED ORDER — FENTANYL CITRATE (PF) 2500 MCG/50ML IJ SOLN
INTRAMUSCULAR | Status: DC | PRN
Start: 2019-12-02 — End: 2019-12-03
  Administered 2019-12-02: 12 mL/h via EPIDURAL

## 2019-12-02 MED ORDER — PHENYLEPHRINE 40 MCG/ML (10ML) SYRINGE FOR IV PUSH (FOR BLOOD PRESSURE SUPPORT): 80.0000 ug | PREFILLED_SYRINGE | INTRAVENOUS | Status: DC | PRN

## 2019-12-02 MED ORDER — ONDANSETRON HCL 4 MG/2ML IJ SOLN: 4.0000 mg | Freq: Four times a day (QID) | INTRAMUSCULAR | Status: DC | PRN

## 2019-12-02 MED ORDER — SOD CITRATE-CITRIC ACID 500-334 MG/5ML PO SOLN: 30.0000 mL | ORAL | Status: DC | PRN

## 2019-12-02 MED ORDER — EPHEDRINE 5 MG/ML INJ: 10.0000 mg | INTRAVENOUS | Status: DC | PRN

## 2019-12-02 MED ORDER — OXYCODONE-ACETAMINOPHEN 5-325 MG PO TABS: 2.0000 | ORAL_TABLET | ORAL | Status: DC | PRN

## 2019-12-02 MED ORDER — DIPHENHYDRAMINE HCL 50 MG/ML IJ SOLN: 12.5000 mg | INTRAMUSCULAR | Status: DC | PRN

## 2019-12-02 MED ORDER — PARAGARD INTRAUTERINE COPPER IU IUD: INTRAUTERINE_SYSTEM | Freq: Once | INTRAUTERINE | Status: DC

## 2019-12-02 MED ORDER — FENTANYL-BUPIVACAINE-NACL 0.5-0.125-0.9 MG/250ML-% EP SOLN: 12.0000 mL/h | EPIDURAL | Status: DC | PRN

## 2019-12-02 MED ORDER — OXYTOCIN BOLUS FROM INFUSION
333.0000 mL | Freq: Once | INTRAVENOUS | Status: AC
  Administered 2019-12-03: 333 mL via INTRAVENOUS

## 2019-12-02 MED ORDER — LIDOCAINE HCL (PF) 1 % IJ SOLN
INTRAMUSCULAR | Status: DC | PRN
  Administered 2019-12-02: 10 mL via EPIDURAL
  Administered 2019-12-02: 2 mL via EPIDURAL

## 2019-12-02 MED ORDER — ACETAMINOPHEN 325 MG PO TABS: 650.0000 mg | ORAL_TABLET | ORAL | Status: DC | PRN

## 2019-12-02 MED ORDER — LACTATED RINGERS IV SOLN: INTRAVENOUS | Status: DC

## 2019-12-02 MED ORDER — OXYTOCIN-SODIUM CHLORIDE 30-0.9 UT/500ML-% IV SOLN: 2.5000 [IU]/h | INTRAVENOUS | Status: DC

## 2019-12-02 MED ORDER — OXYTOCIN-SODIUM CHLORIDE 30-0.9 UT/500ML-% IV SOLN
1.0000 m[IU]/min | INTRAVENOUS | Status: DC
  Administered 2019-12-02: 2 m[IU]/min via INTRAVENOUS
  Filled 2019-12-02: qty 500

## 2019-12-02 MED ORDER — LACTATED RINGERS IV SOLN: 500.0000 mL | INTRAVENOUS | Status: DC | PRN

## 2019-12-02 MED ORDER — OXYTOCIN 10 UNIT/ML IJ SOLN: 10.0000 [IU] | Freq: Once | INTRAMUSCULAR | Status: DC | PRN

## 2019-12-02 MED ORDER — TERBUTALINE SULFATE 1 MG/ML IJ SOLN: 0.2500 mg | Freq: Once | INTRAMUSCULAR | Status: DC | PRN

## 2019-12-02 NOTE — Plan of Care (Signed)
  Problem: Clinical Measurements: Goal: Diagnostic test results will improve Outcome: Progressing Goal: Respiratory complications will improve Outcome: Progressing   Problem: Education: Goal: Knowledge of Childbirth will improve Outcome: Progressing Goal: Ability to make informed decisions regarding treatment and plan of care will improve Outcome: Progressing Goal: Ability to state and carry out methods to decrease the pain will improve Outcome: Progressing   Problem: Education: Goal: Knowledge of General Education information will improve Description: Including pain rating scale, medication(s)/side effects and non-pharmacologic comfort measures Outcome: Progressing

## 2019-12-02 NOTE — Anesthesia Preprocedure Evaluation (Signed)
Anesthesia Evaluation  Patient identified by MRN, date of birth, ID band Patient awake    Reviewed: Allergy & Precautions, Patient's Chart, lab work & pertinent test results  Airway Mallampati: II  TM Distance: >3 FB Neck ROM: Full    Dental no notable dental hx.    Pulmonary neg pulmonary ROS,    Pulmonary exam normal breath sounds clear to auscultation       Cardiovascular hypertension (hx PIH- on aspirin), Normal cardiovascular exam Rhythm:Regular Rate:Normal     Neuro/Psych  Headaches, negative psych ROS   GI/Hepatic Neg liver ROS, GERD  Medicated and Controlled,  Endo/Other  Obesity BMI 33  Renal/GU negative Renal ROS  negative genitourinary   Musculoskeletal negative musculoskeletal ROS (+)   Abdominal   Peds  Hematology  (+) Blood dyscrasia, anemia , hct 35.4, plt 398 Hx PPH   Anesthesia Other Findings   Reproductive/Obstetrics (+) Pregnancy G2P1                            Anesthesia Physical Anesthesia Plan  ASA: II and emergent  Anesthesia Plan: Epidural   Post-op Pain Management:    Induction:   PONV Risk Score and Plan:   Airway Management Planned: Natural Airway  Additional Equipment: None  Intra-op Plan:   Post-operative Plan:   Informed Consent: I have reviewed the patients History and Physical, chart, labs and discussed the procedure including the risks, benefits and alternatives for the proposed anesthesia with the patient or authorized representative who has indicated his/her understanding and acceptance.       Plan Discussed with:   Anesthesia Plan Comments:         Anesthesia Quick Evaluation

## 2019-12-02 NOTE — Progress Notes (Addendum)
Labor Progress Note Nicole Lane is a 24 y.o. G2P1001 at [redacted]w[redacted]d presented for IOL for ICP.  S: Patient is doing well. Feels comfortable with epidural, not feeling her contractions.  O:  BP 110/77   Pulse 96   Temp 98.2 F (36.8 C)   Resp 15   Ht 5\' 6"  (1.676 m)   Wt 93.4 kg   BMI 33.25 kg/m  EFM: baseline 130/moderate variability/+accels, no decels Toco: q2-3 min  CVE: Dilation: 5 Effacement (%): 70 Cervical Position: Middle Station: -2 Presentation: Vertex Exam by:: [email or web address], rnc    A&P: 24 y.o. G2P1001 [redacted]w[redacted]d here for IOL for ICP #Labor: s/p foley bulb. On Pit 2x2, at 44mL/hr. Next SVE 1.5 hours, could consider AROM and IUPC at that time if appropriate. #Pain: Epidural #FWB: Cat I  #GBS negative #ICP: Bile acids 38.6 on 9/1 lab. Benadryl PRN for itching, has not required.  11/1, DO 11:46 PM  Attestation of Supervision of Student:  I confirm that I have verified the information documented in the  resident's  note and that I have also personally reperformed the history, physical exam and all medical decision making activities.  I have verified that all services and findings are accurately documented in this student's note; and I agree with management and plan as outlined in the documentation. I have also made any necessary editorial changes.  Sabino Dick, MD [facility], [facility] 12/03/2019 1:17 AM

## 2019-12-02 NOTE — Anesthesia Procedure Notes (Signed)
Epidural Patient location during procedure: OB Start time: 12/02/2019 8:55 PM End time: 12/02/2019 9:03 PM  Staffing Anesthesiologist: Lannie Fields, DO Performed: anesthesiologist   Preanesthetic Checklist Completed: patient identified, IV checked, risks and benefits discussed, monitors and equipment checked, pre-op evaluation and timeout performed  Epidural Patient position: sitting Prep: DuraPrep and site prepped and draped Patient monitoring: continuous pulse ox, blood pressure, heart rate and cardiac monitor Approach: midline Location: L3-L4 Injection technique: LOR air  Needle:  Needle type: Tuohy  Needle gauge: 17 G Needle length: 9 cm Needle insertion depth: 6 cm Catheter type: closed end flexible Catheter size: 19 Gauge Catheter at skin depth: 11 cm Test dose: negative  Assessment Sensory level: T8 Events: blood not aspirated, injection not painful, no injection resistance, no paresthesia and negative IV test  Additional Notes Patient identified. Risks/Benefits/Options discussed with patient including but not limited to bleeding, infection, nerve damage, paralysis, failed block, incomplete pain control, headache, blood pressure changes, nausea, vomiting, reactions to medication both or allergic, itching and postpartum back pain. Confirmed with bedside nurse the patient's most recent platelet count. Confirmed with patient that they are not currently taking any anticoagulation, have any bleeding history or any family history of bleeding disorders. Patient expressed understanding and wished to proceed. All questions were answered. Sterile technique was used throughout the entire procedure. Please see nursing notes for vital signs. Test dose was given through epidural catheter and negative prior to continuing to dose epidural or start infusion. Warning signs of high block given to the patient including shortness of breath, tingling/numbness in hands, complete motor block,  or any concerning symptoms with instructions to call for help. Patient was given instructions on fall risk and not to get out of bed. All questions and concerns addressed with instructions to call with any issues or inadequate analgesia.  Reason for block:procedure for pain

## 2019-12-02 NOTE — Telephone Encounter (Signed)
Patient called and verified her identity via birth date and last 4 of her SSN.  Patient agreeable to results via phone and was informed of elevated bile acids & diagnosis of cholestasis. Recommend delivery. Patient will come to the hospital for direct admission to birthing suites.     Judeth Horn, NP

## 2019-12-02 NOTE — H&P (Signed)
OBSTETRIC ADMISSION HISTORY AND PHYSICAL  Nicole Lane is a 24 y.o. female G2P1001 with IUP at [redacted]w[redacted]d by LMP presenting for IOL for ICP.   Reports fetal movement. Denies vaginal bleeding.  She received her prenatal care at [facility].  Support person in labor: Gerado  Ultrasounds . Anatomy U/S: normal  Prenatal History/Complications: . None  OB BOX: Nursing Staff Provider  Office Location  [facility] Dating  Early U/S  Language  English Anatomy US  normal  Flu Vaccine  N/A Genetic Screen  NIPS: low-risk female   AFP: screen negative  First Screen:  Quad:    TDaP vaccine   09/28/19 Hgb A1C or  GTT  Third trimester- Normal Glucose, Fasting 65 - 91 mg/dL 74   Glucose, 1 hour 65 - 179 mg/dL 527   Glucose, 2 hour 65 - 152 mg/dL 92     Rhogam  N/A   LAB RESULTS     Blood Type O/Positive/-- (03/31 0908)  Feeding Plan Breast Antibody Negative (03/31 0908)  Contraception Pills? Rubella 1.16 (03/31 0908) IMMUNE  Circumcision If boy, no RPR Non Reactive (03/31 0908)   Pediatrician  Dr. Modesto Charon HBsAg Negative (03/31 0908)   Support Person Family HCVAb Negative  Prenatal Classes Info given HIV Non Reactive (03/31 0908)  BTL Consent N/A GBS  NEGATIVE (For PCN allergy, check sensitivities)   VBAC Consent N/A Pap 01/2019 Normal    Hgb Electro   neg Horizon  BP Cuff Has at home CF  neg Horizon  Weight scale Has at home SMA  neg Horizon    Waterbirth  [ ]  Class [ ]  Consent [ ]  CNM visit   Past Medical History: Past Medical History:  Diagnosis Date  . Blood transfusion without reported diagnosis    PPH  . GERD (gastroesophageal reflux disease)    taking generic form of Prilosec, which helps  . Headache    occasionally, Tylenol helps  . Infection    UTI  . Pregnancy induced hypertension    on baby ASA daily; took last one 12/01/2019 at 2100    Past Surgical History: Past Surgical History:  Procedure Laterality Date  . NO PAST SURGERIES      Obstetrical  History: OB History    Gravida  2   Para  1   Term  1   Preterm  0   AB  0   Living  1     SAB  0   TAB  0   Ectopic  0   Multiple  0   Live Births  1           Social History: Social History   Socioeconomic History  . Marital status: Single    Spouse name: Not on file  . Number of children: Not on file  . Years of education: Not on file  . Highest education level: Associate degree: occupational, , or vocational program  Occupational History  . Occupation: Patient Care  Tobacco Use  . Smoking status: Never Smoker  . Smokeless tobacco: Never Used  Vaping Use  . Vaping Use: Never used  Substance and Sexual Activity  . Alcohol use: Never  . Drug use: Never  . Sexual activity: Yes    Birth control/protection: None  Other Topics Concern  . Not on file  Social History Narrative  . Not on file   Social Determinants of Health   Financial Resource Strain:   . Difficulty of Paying Living Expenses: Not on  file  Food Insecurity:   . Worried About Programme researcher, broadcasting/film/video in the Last Year: Not on file  . Ran Out of Food in the Last Year: Not on file  Transportation Needs:   . Lack of Transportation (Medical): Not on file  . Lack of Transportation (Non-Medical): Not on file  Physical Activity:   . Days of Exercise per Week: Not on file  . Minutes of Exercise per Session: Not on file  Stress:   . Feeling of Stress : Not on file  Social Connections:   . Frequency of Communication with Friends and Family: Not on file  . Frequency of Social Gatherings with Friends and Family: Not on file  . Attends Religious Services: Not on file  . Active Member of Clubs or Organizations: Not on file  . Attends Banker Meetings: Not on file  . Marital Status: Not on file    Family History: Family History  Problem Relation Age of Onset  . Cancer Father   . Hypertension Father     Allergies: No Known Allergies  Medications Prior to Admission   Medication Sig Dispense Refill Last Dose  . aspirin EC 81 MG tablet Take 1 tablet (81 mg total) by mouth daily. 30 tablet 4   . Butalbital-APAP-Caffeine 50-325-40 MG capsule Take 1-2 capsules by mouth every 6 (six) hours as needed for headache. 30 capsule 1   . clindamycin (CLEOCIN) 300 MG capsule Take 1 capsule (300 mg total) by mouth 3 (three) times daily for 7 days. 21 capsule 0   . cyclobenzaprine (FLEXERIL) 10 MG tablet Take 1 tablet (10 mg total) by mouth 3 (three) times daily as needed for muscle spasms. 30 tablet 2   . ferrous sulfate 325 (65 FE) MG tablet Take 1 tablet (325 mg total) by mouth every other day. 30 tablet 3   . Misc. Devices MISC Dispense one maternity belt for patient 1 each 0   . omeprazole (PRILOSEC) 10 MG capsule Take 1 capsule (10 mg total) by mouth daily. 30 capsule 3   . Prenatal Vit-Fe Fumarate-FA (MULTIVITAMIN-PRENATAL) 27-0.8 MG TABS tablet Take 1 tablet by mouth daily at 12 noon.     . promethazine (PHENERGAN) 25 MG tablet Take 1 tablet (25 mg total) by mouth every 6 (six) hours as needed for nausea or vomiting. 30 tablet 1      Review of Systems  All systems reviewed and negative except as stated in HPI  Blood pressure 106/74, pulse (!) 102, temperature 98.6 F (37 C), temperature source Oral, resp. rate 18, height 5\' 6"  (1.676 m), weight 93.4 kg. General appearance: alert, cooperative and no distress Lungs: no respiratory distress Heart: regular rate  Abdomen: soft, non-tender; gravid  Pelvic: Adequate gynecoid proven to 2807 gm Extremities: Homans sign is negative, no sign of DVT Presentation: cephalic Fetal monitoring: 145 bpm / moderate variability / accels present / decels absent  Uterine activity: irregular UC's Dilation: 1 Effacement (%): 60 Station: Ballotable Exam by:: R Tynetta Bachmann CNM Foley Balloon inserted without difficulty, 60 ml of sterile water instilled, patient tolerated procedure well  Prenatal labs: ABO, Rh: O POSITIVE (6/30  0821) Antibody: NEGATIVE (6/30 0821) Rubella: 1.16 (03/31 0908) IMMUNE RPR: Non Reactive (06/30 0821)  HBsAg: Negative (03/31 0908)  HIV: Non Reactive (06/30 [birth year]-1984)  GBS: Negative/-- (08/27 1129)  Glucola: Normal Genetic screening:  Low-Risk Female  Prenatal Transfer Tool  Maternal Diabetes: No Genetic Screening: Normal Maternal Ultrasounds/Referrals: Normal Fetal Ultrasounds or other Referrals:  None Maternal Substance Abuse:  No Significant Maternal Medications:  None Significant Maternal Lab Results: Group B Strep negative  Results for orders placed or performed during the hospital encounter of 12/02/19 (from the past 24 hour(s))  CBC   Collection Time: 12/02/19 11:54 AM  Result Value Ref Range   WBC 9.5 4.0 - 10.5 K/uL   RBC 4.43 3.87 - 5.11 MIL/uL   Hemoglobin 10.7 (L) 12.0 - 15.0 g/dL   HCT 59.5 (L) 36 - 46 %   MCV 79.9 (L) 80.0 - 100.0 fL   MCH 24.2 (L) 26.0 - 34.0 pg   MCHC 30.2 30.0 - 36.0 g/dL   RDW 63.8 (H) 75.6 - 43.3 %   Platelets 398 150 - 400 K/uL   nRBC 0.2 0.0 - 0.2 %  Type and screen [facility]   Collection Time: 12/02/19 11:54 AM  Result Value Ref Range   ABO/RH(D) PENDING    Antibody Screen PENDING    Sample Expiration      12/05/2019,2359 Performed at [facility], [address], [place], [place] [address]     Patient Active Problem List   Diagnosis Date Noted  . Indication for care in labor or delivery 12/02/2019  . Elevated alanine aminotransferase (ALT) level 11/28/2019  . Anemia during pregnancy in third trimester 11/28/2019  . Heartburn during pregnancy in third trimester 10/13/2019  . Supervision of other normal pregnancy, antepartum 06/29/2019  . Nausea and vomiting of pregnancy, antepartum 06/29/2019    Assessment/Plan:  Nicole Lane is a 24 y.o. G2P1001 at [redacted]w[redacted]d here for IOL 2/2 ICP (dx'd today)  Labor: IOL -- pain control: planning epidural prn  Fetal Wellbeing: EFW 6.5 lbs by Leopold's. Cephalic  by SVE.  -- GBS (Neg) -- continuous fetal monitoring - Category 1   Postpartum Planning -- breast/ OP Paragard IUD -- [x] Tdap (09/28/19)   09/30/19, CNM  12/02/2019, 12:23 PM

## 2019-12-02 NOTE — Telephone Encounter (Signed)
Representative from [employer] called with Bile Acid result of 38.6. Judeth Horn, NP is aware and contact the patient once plan of action is complete.  Clovis Pu, RN

## 2019-12-02 NOTE — Plan of Care (Signed)
  Problem: Education: Goal: Knowledge of General Education information will improve Description Including pain rating scale, medication(s)/side effects and non-pharmacologic comfort measures Outcome: Progressing   

## 2019-12-03 ENCOUNTER — Encounter (HOSPITAL_COMMUNITY): Payer: Self-pay | Admitting: Obstetrics and Gynecology

## 2019-12-03 DIAGNOSIS — Z3A37 37 weeks gestation of pregnancy: Secondary | ICD-10-CM

## 2019-12-03 DIAGNOSIS — O2662 Liver and biliary tract disorders in childbirth: Secondary | ICD-10-CM

## 2019-12-03 DIAGNOSIS — K831 Obstruction of bile duct: Secondary | ICD-10-CM

## 2019-12-03 LAB — RPR: RPR Ser Ql: NONREACTIVE

## 2019-12-03 MED ORDER — WITCH HAZEL-GLYCERIN EX PADS: 1.0000 "application " | MEDICATED_PAD | CUTANEOUS | Status: DC | PRN

## 2019-12-03 MED ORDER — IBUPROFEN 600 MG PO TABS
600.0000 mg | ORAL_TABLET | Freq: Three times a day (TID) | ORAL | Status: DC
  Administered 2019-12-03 – 2019-12-05 (×7): 600 mg via ORAL
  Filled 2019-12-03 (×6): qty 1

## 2019-12-03 MED ORDER — TETANUS-DIPHTH-ACELL PERTUSSIS 5-2.5-18.5 LF-MCG/0.5 IM SUSP: 0.5000 mL | Freq: Once | INTRAMUSCULAR | Status: DC

## 2019-12-03 MED ORDER — SENNOSIDES-DOCUSATE SODIUM 8.6-50 MG PO TABS
2.0000 | ORAL_TABLET | ORAL | Status: DC
  Administered 2019-12-03 – 2019-12-04 (×2): 2 via ORAL
  Filled 2019-12-03 (×2): qty 2

## 2019-12-03 MED ORDER — ACETAMINOPHEN 325 MG PO TABS
650.0000 mg | ORAL_TABLET | Freq: Four times a day (QID) | ORAL | Status: DC
  Administered 2019-12-03 – 2019-12-05 (×8): 650 mg via ORAL
  Filled 2019-12-03 (×9): qty 2

## 2019-12-03 MED ORDER — ONDANSETRON HCL 4 MG/2ML IJ SOLN: 4.0000 mg | INTRAMUSCULAR | Status: DC | PRN

## 2019-12-03 MED ORDER — SIMETHICONE 80 MG PO CHEW: 80.0000 mg | CHEWABLE_TABLET | ORAL | Status: DC | PRN

## 2019-12-03 MED ORDER — DIPHENHYDRAMINE HCL 25 MG PO CAPS: 25.0000 mg | ORAL_CAPSULE | Freq: Four times a day (QID) | ORAL | Status: DC | PRN

## 2019-12-03 MED ORDER — COCONUT OIL OIL: 1.0000 "application " | TOPICAL_OIL | Status: DC | PRN

## 2019-12-03 MED ORDER — ONDANSETRON HCL 4 MG PO TABS: 4.0000 mg | ORAL_TABLET | ORAL | Status: DC | PRN

## 2019-12-03 MED ORDER — DIBUCAINE (PERIANAL) 1 % EX OINT: 1.0000 "application " | TOPICAL_OINTMENT | CUTANEOUS | Status: DC | PRN

## 2019-12-03 MED ORDER — BENZOCAINE-MENTHOL 20-0.5 % EX AERO: 1.0000 "application " | INHALATION_SPRAY | CUTANEOUS | Status: DC | PRN

## 2019-12-03 MED ORDER — PRENATAL MULTIVITAMIN CH
1.0000 | ORAL_TABLET | Freq: Every day | ORAL | Status: DC
  Administered 2019-12-03 – 2019-12-05 (×3): 1 via ORAL
  Filled 2019-12-03 (×3): qty 1

## 2019-12-03 NOTE — Anesthesia Postprocedure Evaluation (Signed)
Anesthesia Post Note  Patient: Nicole Lane  Procedure(s) Performed: AN AD HOC LABOR EPIDURAL     Patient location during evaluation: Mother Baby Anesthesia Type: Epidural Level of consciousness: awake Pain management: satisfactory to patient Vital Signs Assessment: post-procedure vital signs reviewed and stable Respiratory status: spontaneous breathing Cardiovascular status: stable Anesthetic complications: no   No complications documented.  Last Vitals:  Vitals:   12/03/19 0930 12/03/19 1040  BP: 117/72 107/75  Pulse: 93 98  Resp: 20 18  Temp: 37.1 C 36.9 C  SpO2: 100% 98%    Last Pain:  Vitals:   12/03/19 1233  TempSrc:   PainSc: 0-No pain   Pain Goal:                   [employer]

## 2019-12-03 NOTE — Discharge Summary (Signed)
Postpartum Discharge Summary  Date of Service updated 12/05/19     Patient Name: Noelani Harbach DOB: 16-Aug-1995 MRN: [id number]  Date of admission: 12/02/2019 Delivery date:12/03/2019  Delivering provider: Randa Ngo  Date of discharge: 12/05/2019  Admitting diagnosis: Indication for care in labor or delivery [O75.9] Intrauterine pregnancy: [redacted]w[redacted]d    Secondary diagnosis:  Active Problems:   Anemia during pregnancy in third trimester   Indication for care in labor or delivery   Vaginal delivery  Additional problems: as noted above   Discharge diagnosis: Vaginal delivery                                     Post partum procedures:none Augmentation: Pitocin and IP Foley Complications: None  Hospital course: Induction of Labor With Vaginal Delivery   24y.o. yo G2P2002 at 327w2das admitted to the hospital 12/02/2019 for induction of labor.  Indication for induction: Cholestasis of pregnancy.  Patient had an uncomplicated labor course as follows: Membrane Rupture Time/Date: 2:09 AM ,12/03/2019   Delivery Method:Vaginal, Spontaneous  Episiotomy: None  Lacerations:  1st degree;Sulcus  Details of delivery can be found in separate delivery note.  Patient had a routine postpartum course. Patient is discharged home 12/05/19.  Newborn Data: Birth date:12/03/2019  Birth time:7:39 AM  Gender:Female  Living status:Living  Apgars:9 ,9  Weight:3314 g   Magnesium Sulfate received: No BMZ received: No Rhophylac:N/A MMR:N/A T-DaP:Given prenatally Flu: N/A Transfusion:No  Physical exam  Vitals:   12/04/19 1522 12/04/19 1944 12/04/19 2058 12/05/19 0537  BP: 107/71 116/75 106/66 106/68  Pulse: 86 81  65  Resp: '16 15 16 18  ' Temp: 98.2 F (36.8 C) 98.5 F (36.9 C) 98.4 F (36.9 C) 98.3 F (36.8 C)  TempSrc: Oral Oral  Oral  SpO2: 100% 100% 100% 100%  Weight:      Height:       General: alert, cooperative and no distress Lochia: appropriate Uterine Fundus: firm Incision:  N/A DVT Evaluation: No evidence of DVT seen on physical exam. Labs: Lab Results  Component Value Date   WBC 11.6 (H) 12/04/2019   HGB 9.0 (L) 12/04/2019   HCT 29.8 (L) 12/04/2019   MCV 79.9 (L) 12/04/2019   PLT 323 12/04/2019   CMP Latest Ref Rng & Units 11/30/2019  Glucose 65 - 99 mg/dL 106(H)  BUN 6 - 20 mg/dL 6  Creatinine 0.57 - 1.00 mg/dL 0.55(L)  Sodium 134 - 144 mmol/L 137  Potassium 3.5 - 5.2 mmol/L 4.0  Chloride 96 - 106 mmol/L 102  CO2 20 - 29 mmol/L 21  Calcium 8.7 - 10.2 mg/dL 8.9  Total Protein 6.0 - 8.5 g/dL 6.3  Total Bilirubin 0.0 - 1.2 mg/dL 0.4  Alkaline Phos 48 - 121 IU/L 236(H)  AST 0 - 40 IU/L 26  ALT 0 - 32 IU/L 42(H)   EdFlavia Shippercore: Edinburgh Postnatal Depression Scale Screening Tool 12/04/2019  I have been able to laugh and see the funny side of things. (No Data)     After visit meds:  Allergies as of 12/05/2019   No Known Allergies     Medication List    STOP taking these medications   aspirin EC 81 MG tablet   cyclobenzaprine 10 MG tablet Commonly known as: FLEXERIL   Misc. Devices Misc     TAKE these medications   Butalbital-APAP-Caffeine 50-325-40 MG capsule Take 1-2  capsules by mouth every 6 (six) hours as needed for headache.   clindamycin 300 MG capsule Commonly known as: CLEOCIN Take 1 capsule (300 mg total) by mouth 3 (three) times daily for 7 days.   ferrous sulfate 325 (65 FE) MG tablet Take 1 tablet (325 mg total) by mouth every other day.   ibuprofen 600 MG tablet Commonly known as: ADVIL Take 1 tablet (600 mg total) by mouth every 8 (eight) hours.   multivitamin-prenatal 27-0.8 MG Tabs tablet Take 1 tablet by mouth daily at 12 noon.   omeprazole 10 MG capsule Commonly known as: PRILOSEC Take 1 capsule (10 mg total) by mouth daily.   promethazine 25 MG tablet Commonly known as: PHENERGAN Take 1 tablet (25 mg total) by mouth every 6 (six) hours as needed for nausea or vomiting.        Discharge home in  stable condition Infant Feeding: Breast Infant Disposition:home with mother Discharge instruction: per After Visit Summary and Postpartum booklet. Activity: Advance as tolerated. Pelvic rest for 6 weeks.  Diet: routine diet Future Appointments: Future Appointments  Date Time Provider [place]  12/07/2019  9:50 AM Jorje Guild, NP [facility] None   Follow up Visit:  Follow-up Information    [facility] Follow up in 4 day(s).   Specialty: Obstetrics and Gynecology Contact information: [place] [phone]               Please schedule this patient for a In person postpartum visit in 4 weeks with the following provider: Any provider. Additional Postpartum F/U:none  High risk pregnancy complicated by: cholestasis of pregnancy Delivery mode:  Vaginal, Spontaneous  Anticipated Birth Control:  oupatient paraguard   12/05/2019 Fatima Blank, CNM

## 2019-12-03 NOTE — Progress Notes (Addendum)
Labor Progress Note Nicole Lane is a 24 y.o. G2P1001 at [redacted]w[redacted]d presented for IOL for ICP S: Patient is doing well, resting. No complaints  O:  BP (!) 92/50   Pulse 91   Temp 98.2 F (36.8 C) (Oral)   Resp 16   Ht 5\' 6"  (1.676 m)   Wt 93.4 kg   BMI 33.25 kg/m  EFM: baseline 125/moderate variability/+accels, no decels  CVE: Dilation: 8 Effacement (%): 90 Cervical Position: Middle Station: 0 Presentation: Vertex Exam by:: Dr. [email or web address]    A&P: 24 y.o. G2P1001 [redacted]w[redacted]d presenting for IOL for ICP #Labor: AROM @0209 , clear fluid. Pit since 1230 on 9/3, now at 36mL/hr #Pain: Epidural  #FWB: Cat I  #GBS negative #ICP: Bile acids 38.6 on 9/1 lab. Benadryl PRN for itching, has not required.  9m, DO 2:27 AM  Attestation of Supervision of Student:  I confirm that I have verified the information documented in the  resident's  note and that I have also personally reperformed the history, physical exam and all medical decision making activities.  I have verified that all services and findings are accurately documented in this student's note; and I agree with management and plan as outlined in the documentation. I have also made any necessary editorial changes.  11/1, MD [facility] for [facility], [facility] 12/03/2019 3:12 AM

## 2019-12-03 NOTE — Lactation Note (Signed)
This note was copied from a baby's chart. Lactation Consultation Note  Patient Name: Nicole Lane [id number] Date: 12/03/2019 Reason for consult: Initial assessment;Early term 37-38.6wks   P2 mom pumped for 2 months with now 24 year old.  She states this infant is latching well and has fed from both sides.  She demonstrated hand expression and collected 3 drops into container with LC assistance.    Infant currently in nursery due to choking episode per mom.    LC reviewed BF basics, importance of hand expression, and STS.   Mom had breast changes with pregnancy.  Desires to BF this child; she had difficulty with latching the first.   Lactation brochure provided and mom is aware of OP LC appt. And phone line.  She was also provided information on BFSG.  LC encouraged family to call out for assistance with latching or if questions or concerns arise regarding feeding.  Mom does not have a breast pump at home.  LC provided a manual pump and put together pump parts and explained usage.  Mom has WIC in [place].     Maternal Data Has patient been taught Hand Expression?: Yes Does the patient have breastfeeding experience prior to this delivery?: Yes  Feeding    LATCH Score                   Interventions Interventions: Breast feeding basics reviewed  Lactation Tools Discussed/Used WIC Program: Yes   Consult Status Consult Status: Follow-up Date: 12/04/19 Follow-up type: In-patient    Maryruth Hancock [facility] 12/03/2019, 6:22 PM

## 2019-12-04 LAB — CBC
HCT: 29.8 % — ABNORMAL LOW (ref 36.0–46.0)
Hemoglobin: 9 g/dL — ABNORMAL LOW (ref 12.0–15.0)
MCH: 24.1 pg — ABNORMAL LOW (ref 26.0–34.0)
MCHC: 30.2 g/dL (ref 30.0–36.0)
MCV: 79.9 fL — ABNORMAL LOW (ref 80.0–100.0)
Platelets: 323 10*3/uL (ref 150–400)
RBC: 3.73 MIL/uL — ABNORMAL LOW (ref 3.87–5.11)
RDW: 15.8 % — ABNORMAL HIGH (ref 11.5–15.5)
WBC: 11.6 10*3/uL — ABNORMAL HIGH (ref 4.0–10.5)
nRBC: 0 % (ref 0.0–0.2)

## 2019-12-04 NOTE — Lactation Note (Signed)
This note was copied from a baby's chart. Lactation Consultation Note  Patient Name: Nicole Lane [id number] Date: 12/04/2019 Reason for consult: Follow-up assessment;Difficult latch;Early term 63-38.6wks Baby 28hrs old, received call from C. Hubbard, RN mom with difficulty latching, states baby last fed ~4am. Mom sitting in bed holding baby, states baby did not latch and concerned baby is not getting enough milk, dad resting on couch. Mom reports pumping x35mo with first baby d/t difficulty latching, would like to breastfeed exclusively and longer with this baby. Unclothed baby, up to right breast skin to skin football hold, mom with flat nipples, colostrum easily expressed with hand expression. Mom latched baby shallowly, LC assisted with obtaining deeper latch. Multiple audible swallows noted (also identified by mom). Reinforced cue based feedings, wake if >3hrs since last feeding, 8-12 in 24hrs, skin to skin, expected length of feeding, hand express after each feeding and offer colostrum back to baby, avoid pacifier use x55mo, call for [facility] support if with difficulty latching or hand expression. Mom voiced understanding and with no further concerns. Left the room with baby still latched to right breast at ~88min mark. BGilliam, RN, IBCLC  Maternal Data    Feeding Feeding Type: Breast Fed  LATCH Score Latch: Grasps breast easily, tongue down, lips flanged, rhythmical sucking.  Audible Swallowing: Spontaneous and intermittent  Type of Nipple: Flat  Comfort (Breast/Nipple): Soft / non-tender  Hold (Positioning): Assistance needed to correctly position infant at breast and maintain latch.  LATCH Score: 8  Interventions Interventions: Breast feeding basics reviewed;Assisted with latch;Skin to skin;Breast massage;Hand express;Breast compression;Support pillows;Expressed milk  Lactation Tools Discussed/Used     Consult Status Consult Status: Follow-up Date: 12/05/19 Follow-up  type: In-patient    Charlynn Court 12/04/2019, 12:01 PM

## 2019-12-04 NOTE — Progress Notes (Signed)
Post Partum Day 1 S/p VD 12/03/2019 at 0739  Subjective: no complaints, up ad lib, voiding, tolerating PO and + flatus  Objective: Blood pressure 90/69, pulse 71, temperature 98 F (36.7 C), temperature source Oral, resp. rate 15, height 5\' 6"  (1.676 m), weight 93.4 kg, SpO2 100 %, unknown if currently breastfeeding.  Physical Exam:  General: alert, cooperative, appears stated age and no distress Lochia: appropriate Uterine Fundus: firm Incision: N/A DVT Evaluation: No evidence of DVT seen on physical exam.  Recent Labs    12/02/19 1154 12/04/19 0407  HGB 10.7* 9.0*  HCT 35.4* 29.8*    Assessment/Plan: Plan for discharge tomorrow  May d/c later today pending Peds discharge of baby   LOS: 2 days   June, CNM 12/04/2019, 9:43 AM

## 2019-12-05 DIAGNOSIS — O9903 Anemia complicating the puerperium: Secondary | ICD-10-CM

## 2019-12-05 MED ORDER — IBUPROFEN 600 MG PO TABS: 600.0000 mg | ORAL_TABLET | Freq: Three times a day (TID) | ORAL | 0 refills | Status: AC

## 2019-12-05 NOTE — Lactation Note (Signed)
This note was copied from a baby's chart. Lactation Consultation Note  Patient Name: Nicole Lane [id number] Date: 12/05/2019 Reason for consult: Follow-up assessment   Mother is a P2, infant is 71 hours old and is now at 8 % wt loss.   Mother reports that infant is breast feeding well. Mother is supplementing infant with formula acceptable amts.  . Discussed doing more hand expression and pumping   Mother reports that she will get a pump from [facility]. She reports she will call [facility] tomorrow and they will see her soom. . She is going home with a harmony hand pump.   Plan of Care : Breastfeed infant with feeding cues Supplement infant with ebm/formula, according to supplemental guidelines. Pump using a DEBP after each feeding for 15-20 mins.   Mother to continue to cue base feed infant and feed at least 8-12 times or more in 24 hours and advised to allow for cluster feeding infant as needed.   Mother to continue to due STS. Mother is aware of available LC services at [facility], BFSG'S, OP Dept, and phone # for questions or concerns about breastfeeding.  Mother receptive to all teaching and plan of care.     Maternal Data    Feeding    LATCH Score                   Interventions Interventions: Breast massage;Hand express;Expressed milk  Lactation Tools Discussed/Used     Consult Status Consult Status: Complete    Michel Bickers 12/05/2019, 12:23 PM

## 2019-12-05 NOTE — Discharge Instructions (Signed)

## 2019-12-07 ENCOUNTER — Encounter: Payer: Medicaid Other | Admitting: Student

## 2020-01-13 ENCOUNTER — Other Ambulatory Visit: Payer: Self-pay

## 2020-01-13 ENCOUNTER — Ambulatory Visit (INDEPENDENT_AMBULATORY_CARE_PROVIDER_SITE_OTHER): Payer: Medicaid Other

## 2020-01-13 VITALS — BP 105/70 | HR 89 | Temp 98.3°F | Ht 65.0 in | Wt 190.8 lb

## 2020-01-13 DIAGNOSIS — R7401 Elevation of levels of liver transaminase levels: Secondary | ICD-10-CM

## 2020-01-13 DIAGNOSIS — Z3202 Encounter for pregnancy test, result negative: Secondary | ICD-10-CM

## 2020-01-13 DIAGNOSIS — Z30430 Encounter for insertion of intrauterine contraceptive device: Secondary | ICD-10-CM

## 2020-01-13 LAB — POCT URINE PREGNANCY: Preg Test, Ur: NEGATIVE

## 2020-01-13 MED ORDER — PARAGARD INTRAUTERINE COPPER IU IUD
INTRAUTERINE_SYSTEM | Freq: Once | INTRAUTERINE | Status: AC
  Administered 2020-01-13: 1 via INTRAUTERINE

## 2020-01-13 NOTE — Progress Notes (Signed)
    GYNECOLOGY OFFICE PROCEDURE NOTE  Nicole Lane is a 24 y.o. [phone] here for Paragard IUD insertion. No GYN concerns.  Last pap smear was on Feb 14, 2019 and was normal.  IUD Insertion Procedure Note IUD: Paragard  Exp: 03/2025  Lot: [id number] NDC: [address]-2  Patient identified, informed consent performed, consent signed.   Discussed risks of irregular bleeding, cramping, infection, malpositioning or misplacement of the IUD outside the uterus which may require further procedure such as laparoscopy. Time out was performed.  Urine pregnancy test negative.  Bimanual exam performed and uterus of normal size, non-tender, and anteflexed position. Speculum placed in the vagina and cervix visualized.  Cervix and vaginal walls cleaned x 3 with betadine solution. Anterior aspect grasped with a single tooth tenaculum.  Uterus sounded to 8 cm.  Paragard IUD placed per manufacturer's recommendations.  Strings trimmed to ~3 cm. Tenaculum was removed, good hemostasis achieved after pressure applied.  Patient tolerated procedure well.   Patient was given post-procedure instructions.  She was advised to have backup contraception for one week.  Patient was instructed to check IUD strings after heavy menses. Patient also instructed to follow up in 4 weeks for IUD check and call and/or report any issues prior to next visit.  Cherre Robins, CNM 01/13/2020

## 2020-01-13 NOTE — Progress Notes (Signed)
Post Partum Visit Note  Nicole Lane is a 24 y.o. [phone] female who presents for a postpartum visit. She is 6 weeks postpartum following a normal spontaneous vaginal delivery.  I have fully reviewed the prenatal and intrapartum course. The delivery was at 37 gestational weeks.  Anesthesia: epidural. Postpartum course has been uncomplicated. Baby is doing well. Baby is feeding by breast. Bleeding staining only. Bowel function is normal. Bladder function is normal. Patient is not sexually active. Contraception method is none. Postpartum depression screening: negative.  Patient reports she receives support from her mother with care of the baby.    The pregnancy intention screening data noted above was reviewed. Potential methods of contraception were discussed. The patient elected to proceed with IUD or IUS.    Edinburgh Postnatal Depression Scale - 01/13/20 1012      Edinburgh Postnatal Depression Scale:  In the Past 7 Days   I have been able to laugh and see the funny side of things. 0    I have looked forward with enjoyment to things. 0    I have blamed myself unnecessarily when things went wrong. 0    I have been anxious or worried for no good reason. 0    I have felt scared or panicky for no good reason. 0    Things have been getting on top of me. 0    I have been so unhappy that I have had difficulty sleeping. 0    I have felt sad or miserable. 0    I have been so unhappy that I have been crying. 0    The thought of harming myself has occurred to me. 0    Edinburgh Postnatal Depression Scale Total 0            The following portions of the patient's history were reviewed and updated as appropriate: allergies, current medications, past family history, past medical history, past social history, past surgical history and problem list.  Review of Systems Pertinent items noted in HPI and remainder of comprehensive ROS otherwise negative.    Objective:  Blood pressure  105/70, pulse 89, temperature 98.3 F (36.8 C), temperature source Oral, height 5\' 5"  (1.651 m), weight 190 lb 12.8 oz (86.5 kg), currently breastfeeding.  General:  alert, cooperative and no distress   Breasts:  Not Examined  Lungs: clear to auscultation bilaterally  Heart:  regular rate and rhythm  Abdomen: soft, non-tender; bowel sounds normal; no masses,  no organomegaly   Vulva:  normal  Vagina: normal vagina-No apparent discharge or lesions.   Cervix:  multiparous appearance and no lesions-Moderate amt yellowish mucoid discharge  Corpus: normal size, contour, position, consistency, mobility, non-tender and anteflexed  Adnexa:  normal adnexa  Rectal Exam: Not performed.        Assessment:  24 year old 4 week postpartum exam Pap Smear UTD H/O Elevated ALTs Paragard IUD Plan:  Okay to return to work when desired. Paragard placed-See separate note. Encouraged to abstain from sex for at least 7 days after Paragard placement.  Will repeat LFTs today. Informed that if elevation continues, would recommend follow up with PCP   Essential components of care per ACOG recommendations:  1.  Mood and well being: Patient with negative depression screening today. Reviewed local resources for support.  - Patient does not use tobacco.  - hx of drug use? No    2. Infant care and feeding:  -Patient currently breastmilk feeding? No  -Social determinants  of health (SDOH) reviewed in EPIC. No concerns  3. Sexuality, contraception and birth spacing - Patient does not want a pregnancy in the next year.  Desired family size is unsure children.  - Reviewed forms of contraception in tiered fashion. Patient desired IUD today.   - Discussed birth spacing of 18 months  4. Sleep and fatigue -Encouraged family/partner/community support of 4 hrs of uninterrupted sleep to help with mood and fatigue  5. Physical Recovery  - Discussed patients delivery and complications.  Patient reports delivery  went well - Patient had a 1st degree laceration, perineal healing reviewed. Patient expressed understanding - Patient has urinary incontinence? No  - Patient is safe to resume physical and sexual activity  6.  Health Maintenance - Last pap smear done November 2020 and was normal with negative HPV. No Mammogram  7. No Chronic Disease - PCP follow up  Cherre Robins, CNM [facility] [employer], [facility]

## 2020-01-14 LAB — HEPATIC FUNCTION PANEL
ALT: 50 IU/L — ABNORMAL HIGH (ref 0–32)
AST: 22 IU/L (ref 0–40)
Albumin: 4.1 g/dL (ref 3.9–5.0)
Alkaline Phosphatase: 149 IU/L — ABNORMAL HIGH (ref 44–121)
Bilirubin Total: 0.5 mg/dL (ref 0.0–1.2)
Bilirubin, Direct: 0.14 mg/dL (ref 0.00–0.40)
Total Protein: 6.6 g/dL (ref 6.0–8.5)

## 2020-01-16 ENCOUNTER — Encounter: Payer: Self-pay | Admitting: General Practice

## 2020-02-17 ENCOUNTER — Ambulatory Visit: Payer: Medicaid Other

## 2020-09-25 IMAGING — US US ABDOMEN LIMITED
1 series · 15 of 25 positions shown · non-contrast
Comparison: Abdominal CT 02/10/2013

CLINICAL DATA: Right upper quadrant pain

EXAM:
ULTRASOUND ABDOMEN LIMITED RIGHT UPPER QUADRANT

[Series 1: us abdomen limited · 15 of 65 slices shown]
[im 1/65]
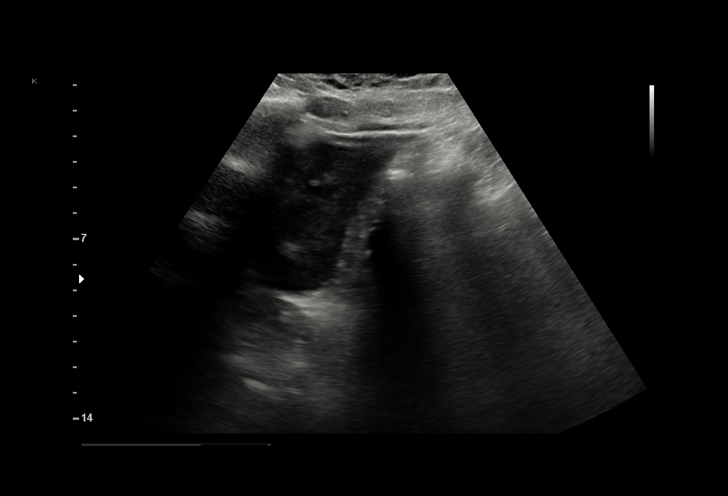
[im 6/65]
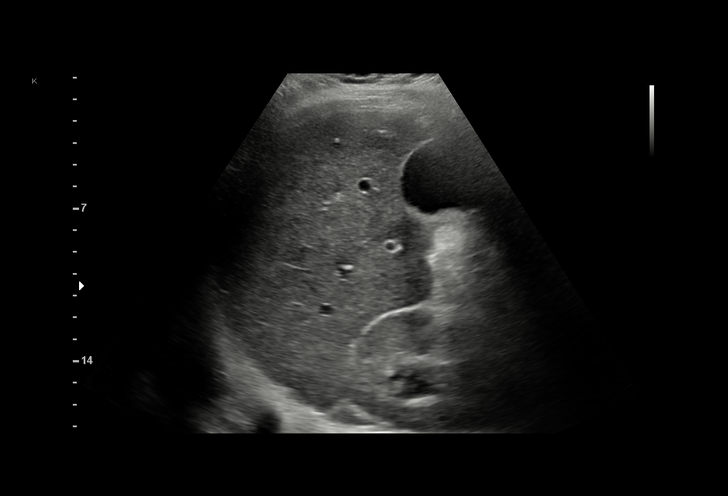
[im 11/65]
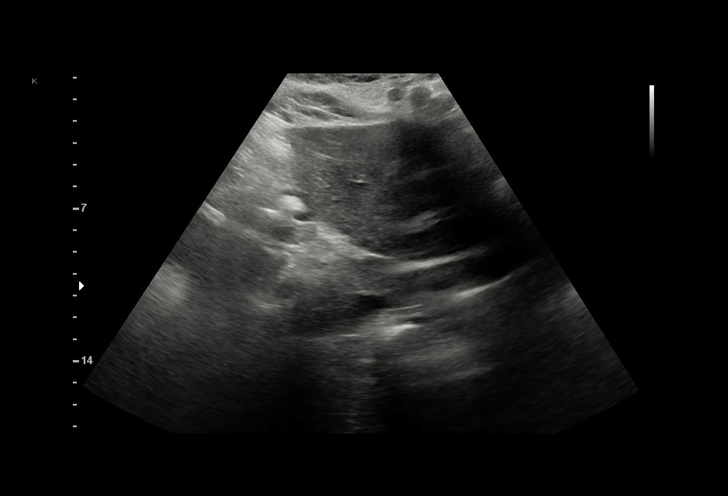
[im 14/65]
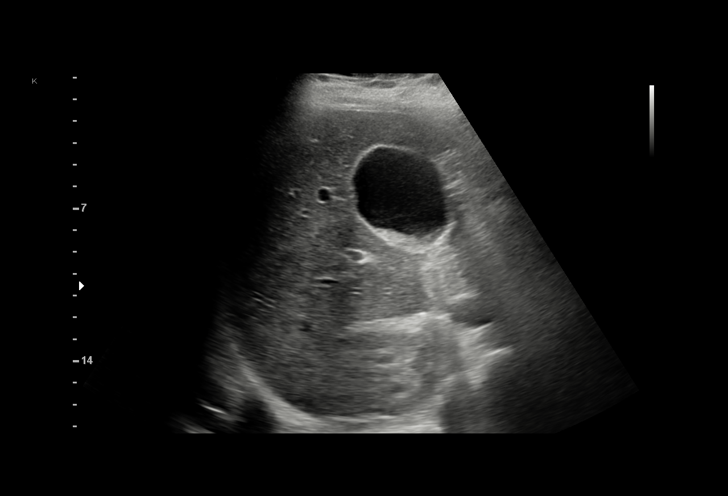
[im 19/65]
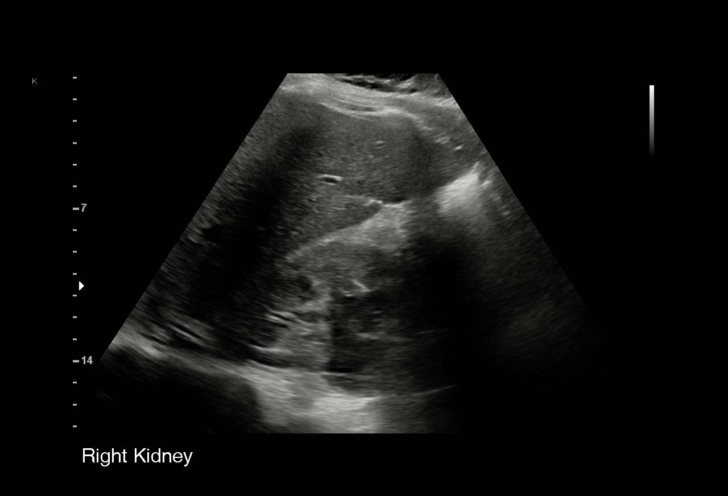
[im 25/65]
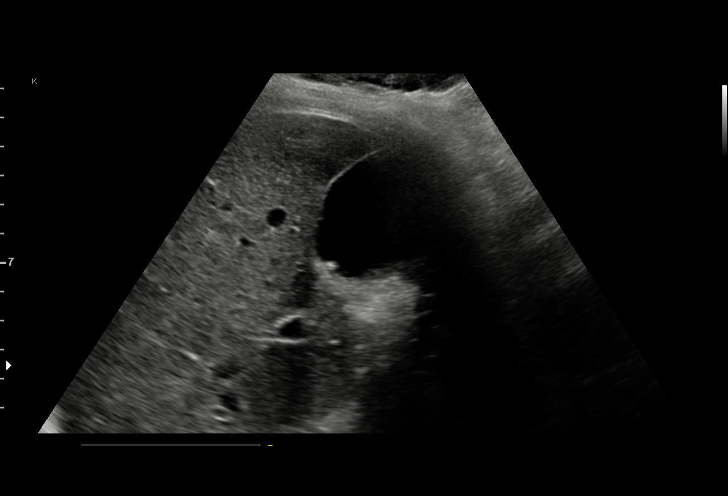
[im 27/65]
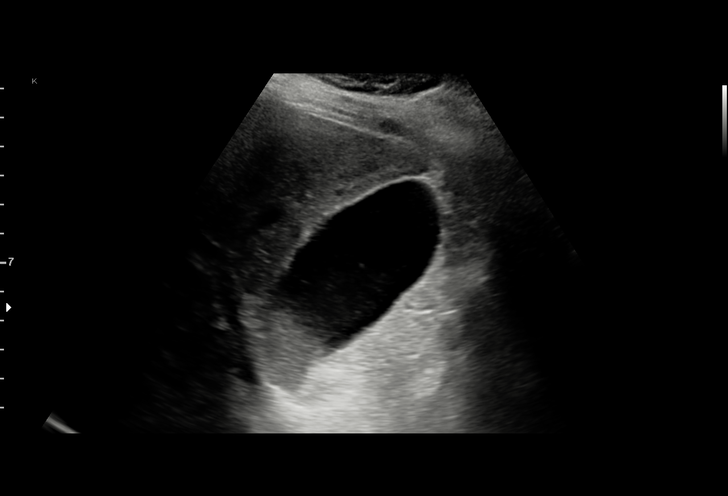
[im 33/65]
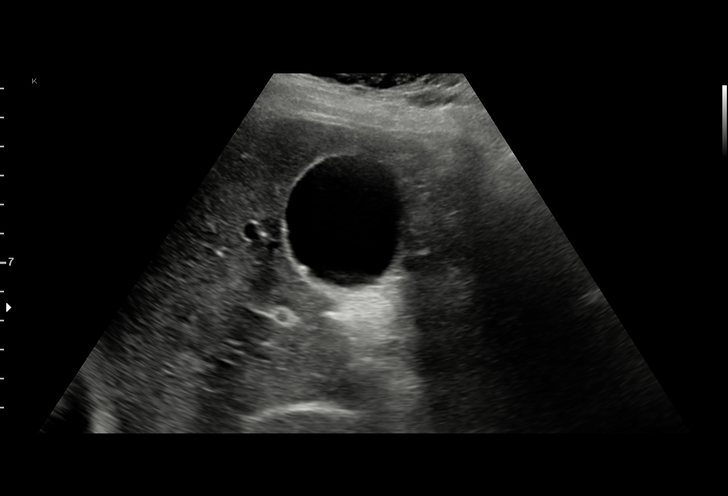
[im 38/65]
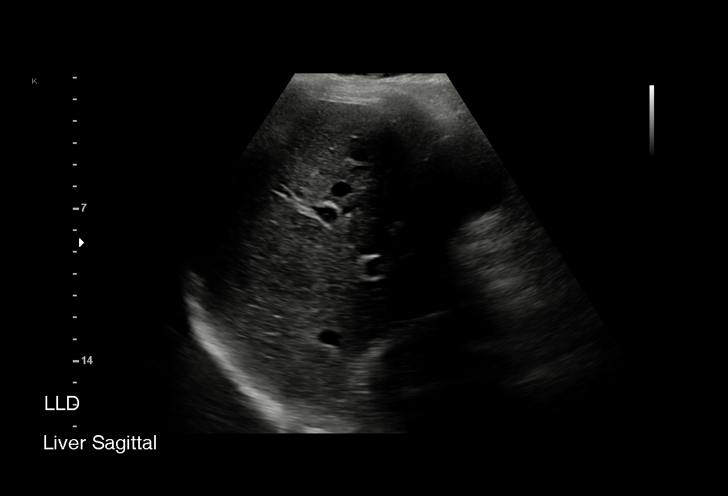
[im 41/65]
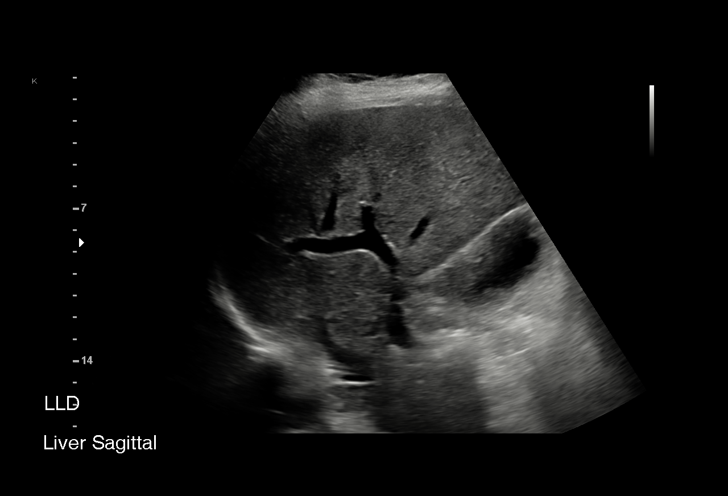
[im 46/65]
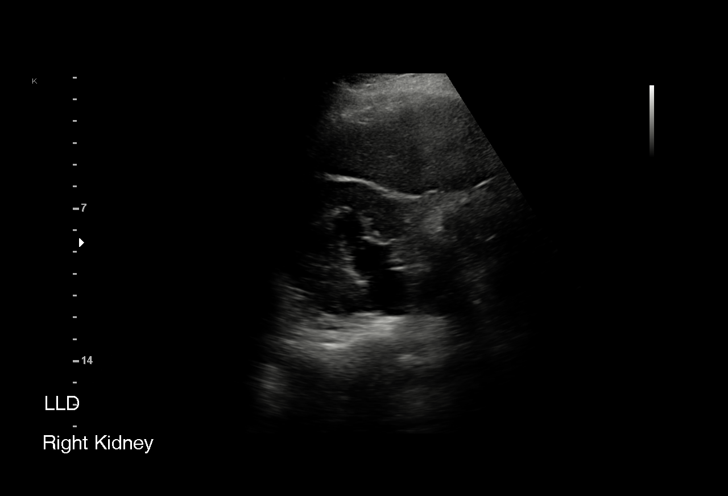
[im 51/65]
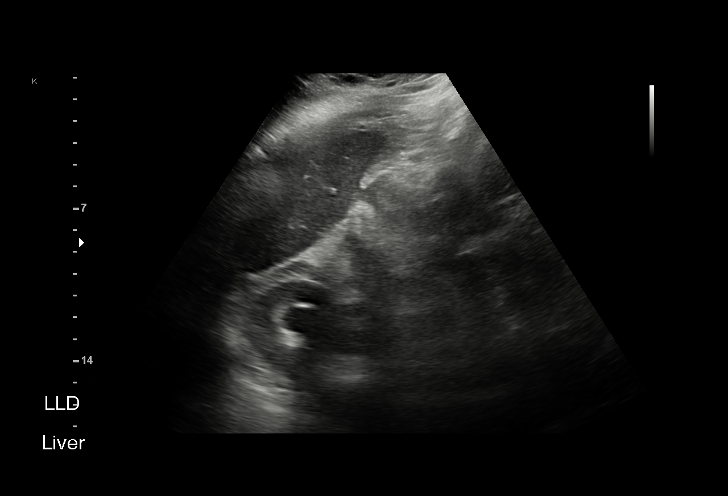
[im 54/65]
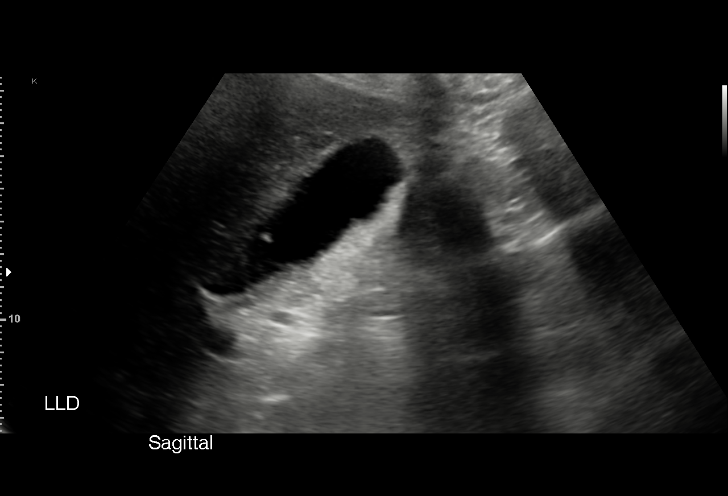
[im 59/65]
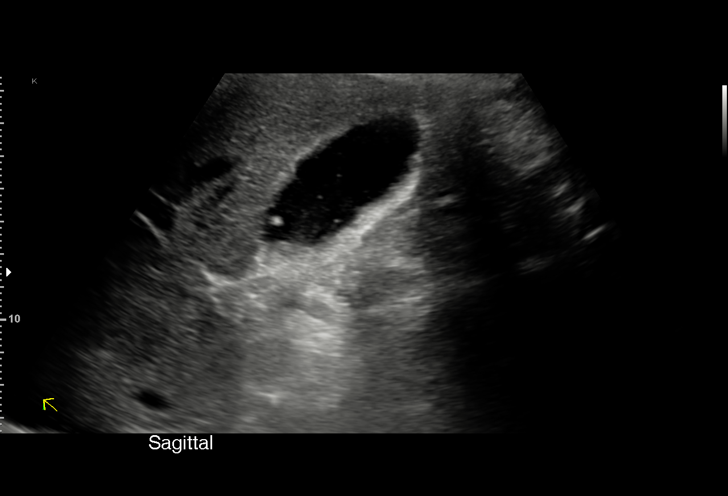
[im 65/65]
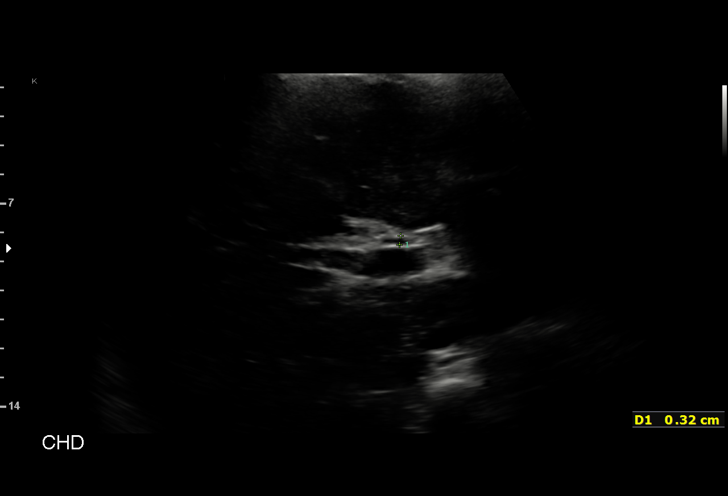

[15 of 25 positions shown; findings below may reference images not displayed]

FINDINGS: Gallbladder:

Distended gallbladder with sludge. No wall thickening or focal
tenderness (as confirmed verbally).

Common bile duct:

Diameter: 3 mm

Liver:

No focal lesion identified. Within normal limits in parenchymal
echogenicity. Portal vein is patent on color Doppler imaging with
normal direction of blood flow towards the liver.

Other: Prominent right hydronephrosis.
IMPRESSION: 1. Prominent right hydronephrosis.
2. Gallbladder sludge with possible superimposed calculi. No
evidence of acute cholecystitis.

## 2023-06-29 ENCOUNTER — Other Ambulatory Visit (HOSPITAL_BASED_OUTPATIENT_CLINIC_OR_DEPARTMENT_OTHER): Payer: Self-pay | Admitting: Family Medicine

## 2023-06-29 DIAGNOSIS — E049 Nontoxic goiter, unspecified: Secondary | ICD-10-CM

## 2023-07-03 ENCOUNTER — Ambulatory Visit (HOSPITAL_BASED_OUTPATIENT_CLINIC_OR_DEPARTMENT_OTHER)
Admission: RE | Admit: 2023-07-03 | Discharge: 2023-07-03 | Disposition: A | Discharge status: Home / Self Care | Source: Ambulatory Visit | Attending: Family Medicine | Admitting: Family Medicine

## 2023-07-03 ENCOUNTER — Other Ambulatory Visit (HOSPITAL_BASED_OUTPATIENT_CLINIC_OR_DEPARTMENT_OTHER): Admitting: Radiology

## 2023-07-03 DIAGNOSIS — E049 Nontoxic goiter, unspecified: Secondary | ICD-10-CM
